# Patient Record
Sex: Female | Born: 2000 | Race: White | Hispanic: No | Smoking: Former smoker
Health system: Southern US, Community
[De-identification: ages and names within clinical notes are randomized; demographics above are authoritative.]

## PROBLEM LIST (undated history)

## (undated) DIAGNOSIS — L309 Dermatitis, unspecified: Secondary | ICD-10-CM

## (undated) DIAGNOSIS — F319 Bipolar disorder, unspecified: Secondary | ICD-10-CM

## (undated) DIAGNOSIS — F419 Anxiety disorder, unspecified: Secondary | ICD-10-CM

## (undated) DIAGNOSIS — J45909 Unspecified asthma, uncomplicated: Secondary | ICD-10-CM

## (undated) DIAGNOSIS — G43909 Migraine, unspecified, not intractable, without status migrainosus: Secondary | ICD-10-CM

## (undated) HISTORY — DX: Dermatitis, unspecified: L30.9

## (undated) HISTORY — DX: Migraine, unspecified, not intractable, without status migrainosus: G43.909

## (undated) HISTORY — DX: Bipolar disorder, unspecified: F31.9

## (undated) HISTORY — DX: Anxiety disorder, unspecified: F41.9

## (undated) HISTORY — DX: Unspecified asthma, uncomplicated: J45.909

---
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
## 2018-03-17 DIAGNOSIS — H5203 Hypermetropia, bilateral: Secondary | ICD-10-CM | POA: Diagnosis not present

## 2018-03-30 NOTE — Progress Notes (Signed)
   [facility] Phone: [phone]   Date of Visit: 04/01/2018   HPI:  Right Lateral Thigh Pain:  -Reports right lateral thigh pain for the past 2weeks  -denies any modification of activities recently and denies any injury -Pain is mainly in the lateral hip to mid lateral thigh and does not radiate down to the foot -Feels like "bone breaking" -Only happens when she changes from a seated to a standing position.  Unsure how long this lasts but reports that she has to "walk it off" -She reports of intermittent chronic back pain but this has not worsened -Denies any lower extremity weakness, numbness or tingling -Denies any urinary or bowel issues -Denies any pain at night while sleeping -Has tried ibuprofen's once or twice which has not helped  ROS: See HPI.  PMFSH:  Obesity Bipolar DO   PHYSICAL EXAM: BP 124/74   Pulse 77   Temp 98.4 F (36.9 C) (Oral)   Ht 5\' 4"  (1.626 m)   Wt (!) 332 lb (150.6 kg)   LMP 03/26/2018 (Approximate)   SpO2 99%   BMI 56.99 kg/m  GEN: NAD  CV: RRR, no murmurs, rubs, or gallops PULM: CTAB, normal effort MSK: Hip: ROM: normal  Strength: normal other than 4/5 right hip abduction  Right greater trochanter WITH tenderness to palpation. No tenderness over piriformis  No SI joint tenderness Extremity: normal dorsalis pedis and posterior tibial pulses. Normal sensation to light touch.  SKIN: No rash or cyanosis; warm and well-perfused PSYCH: Mood and affect euthymic, normal rate and volume of speech NEURO: Awake, alert, no focal deficits grossly, normal speech   ASSESSMENT/PLAN:  1. Trochanteric bursitis, right hip Symptoms most likely consistent with trochanteric bursitis.  Unlikely this is coming from her back.  No red flags on exam or on history.  Discussed management options including anti-inflammatory medication, steroid course, steroid injection to the bursa.  Patient opted to do oral anti-inflammatory medication.   Naproxen 500 mg twice daily with meals for 5 days then as needed.  Provided exercises as well.  Follow-up if symptoms do not resolve  Palma HolterKanishka G Lily Kernen, MD PGY 3 [facility]

## 2018-04-01 ENCOUNTER — Ambulatory Visit (INDEPENDENT_AMBULATORY_CARE_PROVIDER_SITE_OTHER): Payer: Medicaid Other | Admitting: Internal Medicine

## 2018-04-01 ENCOUNTER — Encounter: Payer: Self-pay | Admitting: Internal Medicine

## 2018-04-01 ENCOUNTER — Other Ambulatory Visit: Payer: Self-pay

## 2018-04-01 VITALS — BP 124/74 | HR 77 | Temp 98.4°F | Ht 64.0 in | Wt 332.0 lb

## 2018-04-01 DIAGNOSIS — M7061 Trochanteric bursitis, right hip: Secondary | ICD-10-CM

## 2018-04-01 MED ORDER — NAPROXEN 500 MG PO TABS: 500.0000 mg | ORAL_TABLET | Freq: Two times a day (BID) | ORAL | 0 refills | Status: DC

## 2018-04-01 NOTE — Patient Instructions (Signed)
Take Naproxen 1 tablet twice a day with food for 5 days, then as needed. Follow up if symptoms do not improve in the next 1-2 weeks.  Trochanteric Bursitis Trochanteric bursitis is a condition that causes hip pain. Trochanteric bursitis happens when fluid-filled sacs (bursae) in the hip get irritated. Normally these sacs absorb shock and help strong bands of tissue (tendons) in your hip glide smoothly over each other and over your hip bones. What are the causes? This condition results from increased friction between the hip bones and the tendons that go over them. This condition can happen if you:  Have weak hips.  Use your hip muscles too much (overuse).  Get hit in the hip.  What increases the risk? This condition is more likely to develop in:  Women.  Adults who are middle-aged or older.  People with arthritis or a spinal condition.  People with weak buttocks muscles (gluteal muscles).  People who have one leg that is shorter than the other.  People who participate in certain kinds of athletic activities, such as: ? Running sports, especially long-distance running. ? Contact sports, like football or martial arts. ? Sports in which falls may occur, like skiing.  What are the signs or symptoms? The main symptom of this condition is pain and tenderness over the point of your hip. The pain may be:  Sharp and intense.  Dull and achy.  Felt on the outside of your thigh.  It may increase when you:  Lie on your side.  Walk or run.  Go up on stairs.  Sit.  Stand up after sitting.  Stand for long periods of time.  How is this diagnosed? This condition may be diagnosed based on:  Your symptoms.  Your medical history.  A physical exam.  Imaging tests, such as: ? X-rays to check your bones. ? An MRI or ultrasound to check your tendons and muscles.  During your physical exam, your health care provider will check the movement and strength of your hip. He or she  may press on the point of your hip to check for pain. How is this treated? This condition may be treated by:  Resting.  Reducing your activity.  Avoiding activities that cause pain.  Using crutches, a cane, or a walker to decrease the strain on your hip.  Taking medicine to help with swelling.  Having medicine injected into the bursae to help with swelling.  Using ice, heat, and massage therapy for pain relief.  Physical therapy exercises for strength and flexibility.  Surgery (rare).  Follow these instructions at home: Activity  Rest.  Avoid activities that cause pain.  Return to your normal activities as told by your health care provider. Ask your health care provider what activities are safe for you. Managing pain, stiffness, and swelling  Take over-the-counter and prescription medicines only as told by your health care provider.  If directed, apply heat to the injured area as told by your health care provider. ? Place a towel between your skin and the heat source. ? Leave the heat on for 20-30 minutes. ? Remove the heat if your skin turns bright red. This is especially important if you are unable to feel pain, heat, or cold. You may have a greater risk of getting burned.  If directed, apply ice to the injured area: ? Put ice in a plastic bag. ? Place a towel between your skin and the bag. ? Leave the ice on for 20 minutes, 2-3 times  a day. General instructions  If the affected leg is one that you use for driving, ask your health care provider when it is safe to drive.  Use crutches, a cane, or a walker as told by your health care provider.  If one of your legs is shorter than the other, get fitted for a shoe insert.  Lose weight if you are overweight. How is this prevented?  Wear supportive footwear that is appropriate for your sport.  If you have hip pain, start any new exercise or sport slowly.  Maintain physical fitness,  including: ? Strength. ? Flexibility. Contact a health care provider if:  Your pain does not improve with 2-4 weeks. Get help right away if:  You develop severe pain.  You have a fever.  You develop increased redness over your hip.  You have a change in your bowel function or bladder function.  You cannot control the muscles in your feet. This information is not intended to replace advice given to you by your health care provider. Make sure you discuss any questions you have with your health care provider. Document Released: 11/14/2004 Document Revised: 06/12/2016 Document Reviewed: 09/22/2015 Elsevier Interactive Patient Education  2018 Elsevier Inc.   Trochanteric Bursitis Rehab Ask your health care provider which exercises are safe for you. Do exercises exactly as told by your health care provider and adjust them as directed. It is normal to feel mild stretching, pulling, tightness, or discomfort as you do these exercises, but you should stop right away if you feel sudden pain or your pain gets worse.Do not begin these exercises until told by your health care provider. Stretching exercises These exercises warm up your muscles and joints and improve the movement and flexibility of your hip. These exercises also help to relieve pain and stiffness. Exercise A: Iliotibial band stretch  1. Lie on your side with your left / right leg in the top position. 2. Bend your left / right knee and grab your ankle. 3. Slowly bring your knee back so your thigh is behind your body. 4. Slowly lower your knee toward the floor until you feel a gentle stretch on the outside of your left / right thigh. If you do not feel a stretch and your knee will not fall farther, place the heel of your other foot on top of your outer knee and pull your thigh down farther. 5. Hold this position for __________ seconds. 6. Slowly return to the starting position. Repeat __________ times. Complete this exercise  __________ times a day. Strengthening exercises These exercises build strength and endurance in your hip and pelvis. Endurance is the ability to use your muscles for a long time, even after they get tired. Exercise B: Bridge ( hip extensors) 1. Lie on your back on a firm surface with your knees bent and your feet flat on the floor. 2. Tighten your buttocks muscles and lift your buttocks off the floor until your trunk is level with your thighs. You should feel the muscles working in your buttocks and the back of your thighs. If this exercise is too easy, try doing it with your arms crossed over your chest. 3. Hold this position for __________ seconds. 4. Slowly return to the starting position. 5. Let your muscles relax completely between repetitions. Repeat __________ times. Complete this exercise __________ times a day. Exercise C: Squats ( knee extensors and  quadriceps) 1. Stand in front of a table, with your feet and knees pointing straight ahead. You  may rest your hands on the table for balance but not for support. 2. Slowly bend your knees and lower your hips like you are going to sit in a chair. ? Keep your weight over your heels, not over your toes. ? Keep your lower legs upright so they are parallel with the table legs. ? Do not let your hips go lower than your knees. ? Do not bend lower than told by your health care provider. ? If your hip pain increases, do not bend as low. 3. Hold this position for __________ seconds. 4. Slowly push with your legs to return to standing. Do not use your hands to pull yourself to standing. Repeat __________ times. Complete this exercise __________ times a day. Exercise D: Hip hike 1. Stand sideways on a bottom step. Stand on your left / right leg with your other foot unsupported next to the step. You can hold onto the railing or wall if needed for balance. 2. Keeping your knees straight and your torso square, lift your left / right hip up toward the  ceiling. 3. Hold this position for __________ seconds. 4. Slowly let your left / right hip lower toward the floor, past the starting position. Your foot should get closer to the floor. Do not lean or bend your knees. Repeat __________ times. Complete this exercise __________ times a day. Exercise E: Single leg stand 1. Stand near a counter or door frame that you can hold onto for balance as needed. It is helpful to stand in front of a mirror for this exercise so you can watch your hip. 2. Squeeze your left / right buttock muscles then lift up your other foot. Do not let your left / right hip push out to the side. 3. Hold this position for __________ seconds. Repeat __________ times. Complete this exercise __________ times a day. This information is not intended to replace advice given to you by your health care provider. Make sure you discuss any questions you have with your health care provider. Document Released: 11/14/2004 Document Revised: 06/13/2016 Document Reviewed: 09/22/2015 Elsevier Interactive Patient Education  [employer]2018 Else[employer] Inc.

## 2018-04-22 DIAGNOSIS — F439 Reaction to severe stress, unspecified: Secondary | ICD-10-CM | POA: Diagnosis not present

## 2018-04-22 DIAGNOSIS — F319 Bipolar disorder, unspecified: Secondary | ICD-10-CM | POA: Diagnosis not present

## 2018-04-30 DIAGNOSIS — F319 Bipolar disorder, unspecified: Secondary | ICD-10-CM | POA: Diagnosis not present

## 2018-04-30 DIAGNOSIS — F439 Reaction to severe stress, unspecified: Secondary | ICD-10-CM | POA: Diagnosis not present

## 2018-05-12 ENCOUNTER — Ambulatory Visit: Payer: Medicaid Other | Admitting: Audiology

## 2018-05-13 ENCOUNTER — Ambulatory Visit: Payer: Medicaid Other | Attending: Audiology | Admitting: Audiology

## 2018-05-13 DIAGNOSIS — R292 Abnormal reflex: Secondary | ICD-10-CM

## 2018-05-13 DIAGNOSIS — H748X9 Other specified disorders of middle ear and mastoid, unspecified ear: Secondary | ICD-10-CM

## 2018-05-13 DIAGNOSIS — Z01110 Encounter for hearing examination following failed hearing screening: Secondary | ICD-10-CM

## 2018-05-13 DIAGNOSIS — H9313 Tinnitus, bilateral: Secondary | ICD-10-CM | POA: Diagnosis not present

## 2018-05-13 DIAGNOSIS — H833X3 Noise effects on inner ear, bilateral: Secondary | ICD-10-CM

## 2018-05-13 NOTE — Procedures (Signed)
[facility] [address] [place], [place] [address] Main: [phone] Fax: [phone]  AUDIOLOGICAL EVALUATION  NAME:            Anita [name]           DATE:                        05/13/2018 DOB:               Dec 01, 2000                                REFERRAL:               Hearing decreased, right MRN:               [id number]                              REFERRENT:             Leland HerYoo, Elsia J, DO  CASE HISTORY Anita RenoEmma Leann Staffieri, an 17 y.o.-year-old female, was seen for a repeat audiological evaluation. She was previously seen here on 11/12/2017 with a 40dBHL hearing threshold at 6000Hz  on the left side only with other hearing thresholds within normal limits with reports of tinniutus.  Today, [name] continues to report intermittent tinnitus that occurs "maybe once a day and lasts less than one minute". She noted that she has had tinnitus since her childhood.   She noted "more than twelve" childhood ear infections.  Today, [name] denied any recent otalgia, pressure, or colds, as well as any history of sound sensitivity and ear surgeries. She has not noticed a change in her tinnitus.  No pain was noted.  TEST PROCEDURES Tympanometry, ipsilateral and contralateral acoustic reflexes, pure tone audiometry and speech audiometry were administered.  TEST RESULTS Tympanometry showed normal volume pressure and compliance bilaterally (Type A). Ipsilateral acoustic reflexes were 95-100dB on there right and 90-95dB on the left. Contralateral acoustic reflexes were 100-105dB in each ear from 500Hz  -4000Hz .  Pure tone audiometry hearing thresholds are 0-20 dBHL bilaterally from 250Hz  - 8000Hz  except for a 25 dBHL sensorineural hearing loss in the left ear only at 6000Hz  - which is improved compared to the previous test results.   Speech audiometry revealed speech reception thresholds at 10 dB HL in the right and left ears.  Word recognition in quiet scores  were 100% at 50 dB HL in the right and left ears using recorded NU-6 word lists.  Speech-in-noise scores were 76% at +5 dB SNR.   The overall test reliability was judged to be good. [name] reported uncomfortable loudness levels of 85dBHL which is consistent with slight sound sensitivity.  SUMMARY AND RECOMMENDATIONS Summary: The hearing thresholds are improved compared to January 2019, in particular the 40dBHL hearing threshold at 6000Hz  documented in January 2019 is now 25 dBHL. [name] has normal hearing thresholds bilaterally. She continues to have excellent word recognition in quiet that remains good in minimal background noise in each ear.   Although [name] continues to report intermittent tinnitus, she does not report that it has become louder or more frequent. Please note that tinnitus was not measured during her visit here because she was not experiencing it.  Hearing and tinnitus appear stable. Please note that [name] reports slight sound sensitivity, which is not  new. Hearing conservation was encouraged.  Recommendations: 1.  Monitor hearing at home and at the physician's office. If hearing concerns or tinnitus worsen, please schedule a repeat audiological evaluation here.  2.  Hearing conservation was discussed.   Anita Reno Hubbs will contact us with any questions or concerns.  Lewie Loron, AuD, CCC-A Doctor of Audiology

## 2018-05-14 DIAGNOSIS — F319 Bipolar disorder, unspecified: Secondary | ICD-10-CM | POA: Diagnosis not present

## 2018-05-14 DIAGNOSIS — F439 Reaction to severe stress, unspecified: Secondary | ICD-10-CM | POA: Diagnosis not present

## 2018-05-21 DIAGNOSIS — F439 Reaction to severe stress, unspecified: Secondary | ICD-10-CM | POA: Diagnosis not present

## 2018-05-21 DIAGNOSIS — F319 Bipolar disorder, unspecified: Secondary | ICD-10-CM | POA: Diagnosis not present

## 2018-05-28 DIAGNOSIS — F319 Bipolar disorder, unspecified: Secondary | ICD-10-CM | POA: Diagnosis not present

## 2018-05-28 DIAGNOSIS — F439 Reaction to severe stress, unspecified: Secondary | ICD-10-CM | POA: Diagnosis not present

## 2018-06-04 DIAGNOSIS — F319 Bipolar disorder, unspecified: Secondary | ICD-10-CM | POA: Diagnosis not present

## 2018-06-04 DIAGNOSIS — F439 Reaction to severe stress, unspecified: Secondary | ICD-10-CM | POA: Diagnosis not present

## 2018-06-17 DIAGNOSIS — F319 Bipolar disorder, unspecified: Secondary | ICD-10-CM | POA: Diagnosis not present

## 2018-06-17 DIAGNOSIS — F439 Reaction to severe stress, unspecified: Secondary | ICD-10-CM | POA: Diagnosis not present

## 2018-06-18 DIAGNOSIS — F319 Bipolar disorder, unspecified: Secondary | ICD-10-CM | POA: Diagnosis not present

## 2018-06-18 DIAGNOSIS — F439 Reaction to severe stress, unspecified: Secondary | ICD-10-CM | POA: Diagnosis not present

## 2018-06-25 DIAGNOSIS — F319 Bipolar disorder, unspecified: Secondary | ICD-10-CM | POA: Diagnosis not present

## 2018-06-25 DIAGNOSIS — F439 Reaction to severe stress, unspecified: Secondary | ICD-10-CM | POA: Diagnosis not present

## 2018-07-02 ENCOUNTER — Ambulatory Visit (INDEPENDENT_AMBULATORY_CARE_PROVIDER_SITE_OTHER): Payer: Medicaid Other | Admitting: Family Medicine

## 2018-07-02 ENCOUNTER — Other Ambulatory Visit: Payer: Self-pay

## 2018-07-02 ENCOUNTER — Encounter: Payer: Self-pay | Admitting: Family Medicine

## 2018-07-02 VITALS — BP 110/65 | HR 108 | Temp 98.4°F | Ht 64.0 in | Wt 319.0 lb

## 2018-07-02 DIAGNOSIS — Z23 Encounter for immunization: Secondary | ICD-10-CM

## 2018-07-02 DIAGNOSIS — F319 Bipolar disorder, unspecified: Secondary | ICD-10-CM | POA: Diagnosis not present

## 2018-07-02 DIAGNOSIS — M67432 Ganglion, left wrist: Secondary | ICD-10-CM

## 2018-07-02 DIAGNOSIS — F439 Reaction to severe stress, unspecified: Secondary | ICD-10-CM | POA: Diagnosis not present

## 2018-07-02 MED ORDER — WRIST SPLINT/COCK-UP/LEFT XL MISC: 1.0000 [IU] | Freq: Once | 0 refills | Status: AC

## 2018-07-02 NOTE — Progress Notes (Signed)
   Subjective:    Patient ID: Anita [name] is a 17 y.o. female presenting with Wrist Pain  on 07/02/2018  HPI: Here today for left wrist pain. Has swelling on her wrist. Both the lateral and medial sides. Has noted this x 3 wks. Iced it but this did not help. Has some pain in the area. Has tried Ibuprofen which has not really helped. Worse with working.  Review of Systems  Constitutional: Negative for chills and fever.  Respiratory: Negative for shortness of breath.   Cardiovascular: Negative for chest pain.  Gastrointestinal: Negative for abdominal pain, nausea and vomiting.  Genitourinary: Negative for dysuria.  Musculoskeletal: Positive for joint swelling.  Skin: Negative for rash.      Objective:    BP 110/65   Pulse (!) 108   Temp 98.4 F (36.9 C) (Oral)   Ht 5\' 4"  (1.626 m)   Wt (!) 319 lb (144.7 kg)   SpO2 97%   BMI 54.76 kg/m  Physical Exam  Constitutional: She is oriented to person, place, and time. She appears well-developed and well-nourished. No distress.  HENT:  Head: Normocephalic and atraumatic.  Eyes: No scleral icterus.  Neck: Neck supple.  Cardiovascular: Normal rate.  Pulmonary/Chest: Effort normal.  Abdominal: Soft.  Musculoskeletal:       Left wrist: She exhibits swelling.  Soft mobile swelling over the tendon sheath c/w ganglion cyst  Neurological: She is alert and oriented to person, place, and time.  Skin: Skin is warm and dry.  Psychiatric: She has a normal mood and affect.        Assessment & Plan:   Problem List Items Addressed This Visit      Unprioritized   Ganglion, left wrist - Primary    Written and verbal information given. Treatment options reviewed. Patient opted for trial of conservative therapy with wrist splint and NSAIDS.       Other Visit Diagnoses    Need for immunization against influenza       Relevant Orders   Flu Vaccine QUAD 36+ mos IM (Completed)      Total face-to-face time with patient: 15=0  minutes. Over 50% of encounter was spent on counseling and coordination of care. Return if symptoms worsen or fail to improve.  Reva Boresanya S Corrissa Martello 07/02/2018 3:02 PM

## 2018-07-02 NOTE — Patient Instructions (Signed)
Ganglion Cyst A ganglion cyst is a noncancerous, fluid-filled lump that occurs near joints or tendons. The ganglion cyst grows out of a joint or the lining of a tendon. It most often develops in the hand or wrist, but it can also develop in the shoulder, elbow, hip, knee, ankle, or foot. The round or oval ganglion cyst can be the size of a pea or larger than a grape. Increased activity may enlarge the size of the cyst because more fluid starts to build up. What are the causes? It is not known what causes a ganglion cyst to grow. However, it may be related to:  Inflammation or irritation around the joint.  An injury.  Repetitive movements or overuse.  Arthritis.  What increases the risk? Risk factors include:  Being a woman.  Being age 20-50.  What are the signs or symptoms? Symptoms may include:  A lump. This most often appears on the hand or wrist, but it can occur in other areas of the body.  Tingling.  Pain.  Numbness.  Muscle weakness.  Weak grip.  Less movement in a joint.  How is this diagnosed? Ganglion cysts are most often diagnosed based on a physical exam. Your health care provider will feel the lump and may shine a light alongside it. If it is a ganglion cyst, a light often shines through it. Your health care provider may order an X-ray, ultrasound, or MRI to rule out other conditions. How is this treated? Ganglion cysts usually go away on their own without treatment. If pain or other symptoms are involved, treatment may be needed. Treatment is also needed if the ganglion cyst limits your movement or if it gets infected. Treatment may include:  Wearing a brace or splint on your wrist or finger.  Taking anti-inflammatory medicine.  Draining fluid from the lump with a needle (aspiration).  Injecting a steroid into the joint.  Surgery to remove the ganglion cyst.  Follow these instructions at home:  Do not press on the ganglion cyst, poke it with a  needle, or hit it.  Take medicines only as directed by your health care provider.  Wear your brace or splint as directed by your health care provider.  Watch your ganglion cyst for any changes.  Keep all follow-up visits as directed by your health care provider. This is important. Contact a health care provider if:  Your ganglion cyst becomes larger or more painful.  You have increased redness, red streaks, or swelling.  You have pus coming from the lump.  You have weakness or numbness in the affected area.  You have a fever or chills. This information is not intended to replace advice given to you by your health care provider. Make sure you discuss any questions you have with your health care provider. Document Released: 10/04/2000 Document Revised: 03/14/2016 Document Reviewed: 03/22/2014 Elsevier Interactive Patient Education  2018 Elsevier Inc.  

## 2018-07-04 ENCOUNTER — Encounter: Payer: Self-pay | Admitting: Family Medicine

## 2018-07-04 NOTE — Assessment & Plan Note (Signed)
Written and verbal information given. Treatment options reviewed. Patient opted for trial of conservative therapy with wrist splint and NSAIDS.

## 2018-07-09 DIAGNOSIS — F319 Bipolar disorder, unspecified: Secondary | ICD-10-CM | POA: Diagnosis not present

## 2018-07-09 DIAGNOSIS — F439 Reaction to severe stress, unspecified: Secondary | ICD-10-CM | POA: Diagnosis not present

## 2018-07-16 DIAGNOSIS — F319 Bipolar disorder, unspecified: Secondary | ICD-10-CM | POA: Diagnosis not present

## 2018-07-16 DIAGNOSIS — F439 Reaction to severe stress, unspecified: Secondary | ICD-10-CM | POA: Diagnosis not present

## 2018-07-23 DIAGNOSIS — F439 Reaction to severe stress, unspecified: Secondary | ICD-10-CM | POA: Diagnosis not present

## 2018-07-23 DIAGNOSIS — F319 Bipolar disorder, unspecified: Secondary | ICD-10-CM | POA: Diagnosis not present

## 2018-07-29 DIAGNOSIS — F319 Bipolar disorder, unspecified: Secondary | ICD-10-CM | POA: Diagnosis not present

## 2018-07-29 DIAGNOSIS — F439 Reaction to severe stress, unspecified: Secondary | ICD-10-CM | POA: Diagnosis not present

## 2018-07-30 DIAGNOSIS — F439 Reaction to severe stress, unspecified: Secondary | ICD-10-CM | POA: Diagnosis not present

## 2018-07-30 DIAGNOSIS — F319 Bipolar disorder, unspecified: Secondary | ICD-10-CM | POA: Diagnosis not present

## 2018-08-06 DIAGNOSIS — F439 Reaction to severe stress, unspecified: Secondary | ICD-10-CM | POA: Diagnosis not present

## 2018-08-06 DIAGNOSIS — F319 Bipolar disorder, unspecified: Secondary | ICD-10-CM | POA: Diagnosis not present

## 2018-08-13 DIAGNOSIS — F439 Reaction to severe stress, unspecified: Secondary | ICD-10-CM | POA: Diagnosis not present

## 2018-08-13 DIAGNOSIS — F319 Bipolar disorder, unspecified: Secondary | ICD-10-CM | POA: Diagnosis not present

## 2018-08-20 DIAGNOSIS — F439 Reaction to severe stress, unspecified: Secondary | ICD-10-CM | POA: Diagnosis not present

## 2018-08-20 DIAGNOSIS — F319 Bipolar disorder, unspecified: Secondary | ICD-10-CM | POA: Diagnosis not present

## 2018-08-27 DIAGNOSIS — F319 Bipolar disorder, unspecified: Secondary | ICD-10-CM | POA: Diagnosis not present

## 2018-08-27 DIAGNOSIS — F439 Reaction to severe stress, unspecified: Secondary | ICD-10-CM | POA: Diagnosis not present

## 2018-09-03 DIAGNOSIS — F319 Bipolar disorder, unspecified: Secondary | ICD-10-CM | POA: Diagnosis not present

## 2018-09-03 DIAGNOSIS — F439 Reaction to severe stress, unspecified: Secondary | ICD-10-CM | POA: Diagnosis not present

## 2018-09-10 DIAGNOSIS — F439 Reaction to severe stress, unspecified: Secondary | ICD-10-CM | POA: Diagnosis not present

## 2018-09-10 DIAGNOSIS — F319 Bipolar disorder, unspecified: Secondary | ICD-10-CM | POA: Diagnosis not present

## 2018-09-15 DIAGNOSIS — F439 Reaction to severe stress, unspecified: Secondary | ICD-10-CM | POA: Diagnosis not present

## 2018-09-15 DIAGNOSIS — F319 Bipolar disorder, unspecified: Secondary | ICD-10-CM | POA: Diagnosis not present

## 2018-09-16 ENCOUNTER — Encounter (HOSPITAL_COMMUNITY): Payer: Self-pay

## 2018-09-16 ENCOUNTER — Emergency Department (HOSPITAL_COMMUNITY)
Admission: EM | Admit: 2018-09-16 | Discharge: 2018-09-16 | Disposition: A | Discharge status: Home / Self Care | Payer: Medicaid Other | Attending: Emergency Medicine | Admitting: Emergency Medicine

## 2018-09-16 DIAGNOSIS — S39012A Strain of muscle, fascia and tendon of lower back, initial encounter: Secondary | ICD-10-CM | POA: Diagnosis not present

## 2018-09-16 DIAGNOSIS — Z79899 Other long term (current) drug therapy: Secondary | ICD-10-CM | POA: Insufficient documentation

## 2018-09-16 DIAGNOSIS — S199XXA Unspecified injury of neck, initial encounter: Secondary | ICD-10-CM | POA: Diagnosis present

## 2018-09-16 DIAGNOSIS — Y9389 Activity, other specified: Secondary | ICD-10-CM | POA: Diagnosis not present

## 2018-09-16 DIAGNOSIS — F419 Anxiety disorder, unspecified: Secondary | ICD-10-CM | POA: Diagnosis not present

## 2018-09-16 DIAGNOSIS — S161XXA Strain of muscle, fascia and tendon at neck level, initial encounter: Secondary | ICD-10-CM | POA: Diagnosis not present

## 2018-09-16 DIAGNOSIS — Y998 Other external cause status: Secondary | ICD-10-CM | POA: Insufficient documentation

## 2018-09-16 DIAGNOSIS — F0781 Postconcussional syndrome: Secondary | ICD-10-CM | POA: Diagnosis not present

## 2018-09-16 DIAGNOSIS — J45909 Unspecified asthma, uncomplicated: Secondary | ICD-10-CM | POA: Insufficient documentation

## 2018-09-16 DIAGNOSIS — F319 Bipolar disorder, unspecified: Secondary | ICD-10-CM | POA: Diagnosis not present

## 2018-09-16 DIAGNOSIS — Y92410 Unspecified street and highway as the place of occurrence of the external cause: Secondary | ICD-10-CM | POA: Diagnosis not present

## 2018-09-16 DIAGNOSIS — R51 Headache: Secondary | ICD-10-CM | POA: Diagnosis not present

## 2018-09-16 MED ORDER — IBUPROFEN 800 MG PO TABS: 800.0000 mg | ORAL_TABLET | Freq: Three times a day (TID) | ORAL | 0 refills | Status: DC
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
## 2018-09-24 DIAGNOSIS — F439 Reaction to severe stress, unspecified: Secondary | ICD-10-CM | POA: Diagnosis not present

## 2018-09-24 DIAGNOSIS — F319 Bipolar disorder, unspecified: Secondary | ICD-10-CM | POA: Diagnosis not present

## 2018-10-01 DIAGNOSIS — F439 Reaction to severe stress, unspecified: Secondary | ICD-10-CM | POA: Diagnosis not present

## 2018-10-01 DIAGNOSIS — F319 Bipolar disorder, unspecified: Secondary | ICD-10-CM | POA: Diagnosis not present

## 2018-10-08 DIAGNOSIS — F439 Reaction to severe stress, unspecified: Secondary | ICD-10-CM | POA: Diagnosis not present

## 2018-10-08 DIAGNOSIS — F319 Bipolar disorder, unspecified: Secondary | ICD-10-CM | POA: Diagnosis not present

## 2018-10-22 DIAGNOSIS — F319 Bipolar disorder, unspecified: Secondary | ICD-10-CM | POA: Diagnosis not present

## 2018-10-22 DIAGNOSIS — F439 Reaction to severe stress, unspecified: Secondary | ICD-10-CM | POA: Diagnosis not present

## 2018-10-29 DIAGNOSIS — F439 Reaction to severe stress, unspecified: Secondary | ICD-10-CM | POA: Diagnosis not present

## 2018-10-29 DIAGNOSIS — F319 Bipolar disorder, unspecified: Secondary | ICD-10-CM | POA: Diagnosis not present

## 2018-11-05 DIAGNOSIS — F319 Bipolar disorder, unspecified: Secondary | ICD-10-CM | POA: Diagnosis not present

## 2018-11-05 DIAGNOSIS — F439 Reaction to severe stress, unspecified: Secondary | ICD-10-CM | POA: Diagnosis not present

## 2018-11-12 DIAGNOSIS — F319 Bipolar disorder, unspecified: Secondary | ICD-10-CM | POA: Diagnosis not present

## 2018-11-12 DIAGNOSIS — F439 Reaction to severe stress, unspecified: Secondary | ICD-10-CM | POA: Diagnosis not present

## 2018-11-19 DIAGNOSIS — F439 Reaction to severe stress, unspecified: Secondary | ICD-10-CM | POA: Diagnosis not present

## 2018-11-19 DIAGNOSIS — F319 Bipolar disorder, unspecified: Secondary | ICD-10-CM | POA: Diagnosis not present

## 2018-11-26 DIAGNOSIS — F319 Bipolar disorder, unspecified: Secondary | ICD-10-CM | POA: Diagnosis not present

## 2018-11-26 DIAGNOSIS — F439 Reaction to severe stress, unspecified: Secondary | ICD-10-CM | POA: Diagnosis not present

## 2018-12-03 DIAGNOSIS — F319 Bipolar disorder, unspecified: Secondary | ICD-10-CM | POA: Diagnosis not present

## 2018-12-03 DIAGNOSIS — F439 Reaction to severe stress, unspecified: Secondary | ICD-10-CM | POA: Diagnosis not present

## 2018-12-10 DIAGNOSIS — F319 Bipolar disorder, unspecified: Secondary | ICD-10-CM | POA: Diagnosis not present

## 2018-12-10 DIAGNOSIS — F439 Reaction to severe stress, unspecified: Secondary | ICD-10-CM | POA: Diagnosis not present

## 2018-12-17 DIAGNOSIS — F439 Reaction to severe stress, unspecified: Secondary | ICD-10-CM | POA: Diagnosis not present

## 2018-12-17 DIAGNOSIS — F319 Bipolar disorder, unspecified: Secondary | ICD-10-CM | POA: Diagnosis not present

## 2018-12-22 ENCOUNTER — Encounter (HOSPITAL_COMMUNITY): Payer: Self-pay

## 2018-12-22 ENCOUNTER — Ambulatory Visit (HOSPITAL_COMMUNITY)
Admission: EM | Admit: 2018-12-22 | Discharge: 2018-12-22 | Disposition: A | Discharge status: Home / Self Care | Payer: Medicaid Other | Attending: Family Medicine | Admitting: Family Medicine

## 2018-12-22 DIAGNOSIS — B9789 Other viral agents as the cause of diseases classified elsewhere: Secondary | ICD-10-CM

## 2018-12-22 DIAGNOSIS — J069 Acute upper respiratory infection, unspecified: Secondary | ICD-10-CM | POA: Diagnosis not present

## 2018-12-22 MED ORDER — CETIRIZINE HCL 10 MG PO CAPS: 10.0000 mg | ORAL_CAPSULE | Freq: Every day | ORAL | 0 refills | Status: DC

## 2018-12-22 MED ORDER — FLUTICASONE PROPIONATE 50 MCG/ACT NA SUSP: 1.0000 | Freq: Every day | NASAL | 0 refills | Status: DC

## 2018-12-22 NOTE — ED Provider Notes (Signed)
[facility]    CSN: [id number] Arrival date & time: 12/22/18  1307     History   Chief Complaint Chief Complaint  Patient presents with  . Sinus Issues  . Otalgia    Right  . Sore Throat    HPI Julizza Sassone is a 18 y.o. female history of anxiety, asthma, bipolar disorder, presenting today for evaluation of congestion, sore throat and ear pain.  Patient states that her symptoms began approximately 2 days ago.  Her main complaint is feeling right-sided pressure and congestion.  Has pain in her right ear as well as feels pressure behind her right eye.  She has had some rhinorrhea and is noted the drainage to mainly be clear.  Cough is been mild.  Denies any known fevers.  She is not taking anything over-the-counter for her symptoms.  Denies shortness of breath or chest pain.  HPI  Past Medical History:  Diagnosis Date  . Anxiety   . Asthma    age 76  . Bipolar disorder (HCC)   . Eczema   . Migraines     Patient Active Problem List   Diagnosis Date Noted  . Ganglion, left wrist 07/02/2018  . Eustachian tube disorder, bilateral 04/14/2017  . Hearing decreased, right 04/14/2017  . Obesity 10/16/2016  . Bipolar disorder [facility]     Past Surgical History:  Procedure Laterality Date  . KNEE SURGERY  01/23/2015   L knee torn meniscus and ACL  . TONSILLECTOMY AND ADENOIDECTOMY  2012    OB History   No obstetric history on file.      Home Medications    Prior to Admission medications   Medication Sig Start Date End Date Taking? Authorizing Provider  amitriptyline (ELAVIL) 25 MG tablet Take 50 mg by mouth at bedtime.     [provider]  Cetirizine HCl 10 MG CAPS Take 1 capsule (10 mg total) by mouth daily for 10 days. 12/22/18 01/01/19  Wieters, Hallie C, PA-C  fluticasone (FLONASE) 50 MCG/ACT nasal spray Place 1-2 sprays into both nostrils daily for 7 days. 12/22/18 12/29/18  Wieters, Hallie C, PA-C  ibuprofen (ADVIL,MOTRIN) 800 MG tablet Take 1  tablet (800 mg total) by mouth 3 (three) times daily. 09/16/18   Roxy Horseman, PA-C  Melatonin 10 MG TABS Take 1 tablet by mouth daily. 10/08/16   Leland Her, DO  Oxcarbazepine (TRILEPTAL) 300 MG tablet Take 2 tablets (600 mg total) by mouth at bedtime. Patient taking differently: Take 300 mg by mouth 3 (three) times daily.  12/17/16   Leland Her, DO    Family History Family History  Problem Relation Age of Onset  . Alcohol abuse Mother   . Depression Mother   . Thyroid disease Mother   . Heart attack Mother 38       died of heart attack  . Bipolar disorder Mother   . Vision loss Sister     Social History Social History   Tobacco Use  . Smoking status: Never Smoker  . Smokeless tobacco: Never Used  Substance Use Topics  . Alcohol use: Yes  . Drug use: No    Comment: past marijuana use, former     Allergies   Patient has no known allergies.   Review of Systems Review of Systems  Constitutional: Negative for activity change, appetite change, chills, fatigue and fever.  HENT: Positive for congestion, rhinorrhea, sinus pressure and sore throat. Negative for ear pain and trouble swallowing.  Eyes: Negative for discharge and redness.  Respiratory: Positive for cough. Negative for chest tightness and shortness of breath.   Cardiovascular: Negative for chest pain.  Gastrointestinal: Negative for abdominal pain, diarrhea, nausea and vomiting.  Musculoskeletal: Negative for myalgias.  Skin: Negative for rash.  Neurological: Negative for dizziness, light-headedness and headaches.     Physical Exam Triage Vital Signs ED Triage Vitals  Enc Vitals Group     BP 12/22/18 1405 111/76     Pulse Rate 12/22/18 1405 (!) 102     Resp 12/22/18 1405 18     Temp 12/22/18 1405 98.2 F (36.8 C)     Temp Source 12/22/18 1405 Oral     SpO2 12/22/18 1405 99 %     Weight --      Height --      Head Circumference --      Peak Flow --      Pain Score 12/22/18 1406 6     Pain  Loc --      Pain Edu? --      Excl. in GC? --    No data found.  Updated Vital Signs BP 111/76 (BP Location: Right Arm)   Pulse (!) 102   Temp 98.2 F (36.8 C) (Oral)   Resp 18   LMP 12/08/2018   SpO2 99%   Visual Acuity Right Eye Distance:   Left Eye Distance:   Bilateral Distance:    Right Eye Near:   Left Eye Near:    Bilateral Near:     Physical Exam Vitals signs and nursing note reviewed.  Constitutional:      General: She is not in acute distress.    Appearance: She is well-developed.  HENT:     Head: Normocephalic and atraumatic.     Ears:     Comments: Bilateral ears without tenderness to palpation of external auricle, tragus and mastoid, EAC's without erythema or swelling, TM's with good bony landmarks and cone of light. Non erythematous.    Nose:     Comments: Nasal mucosa erythematous, bilateral swollen turbinates, no rhinorrhea present bilaterally    Mouth/Throat:     Comments: Oral mucosa pink and moist, no tonsillar enlargement or exudate. Posterior pharynx patent and nonerythematous, no uvula deviation or swelling. Normal phonation. Eyes:     Conjunctiva/sclera: Conjunctivae normal.  Neck:     Musculoskeletal: Neck supple.  Cardiovascular:     Rate and Rhythm: Normal rate and regular rhythm.     Heart sounds: No murmur.  Pulmonary:     Effort: Pulmonary effort is normal. No respiratory distress.     Breath sounds: Normal breath sounds.     Comments: Breathing comfortably at rest, CTABL, no wheezing, rales or other adventitious sounds auscultated Abdominal:     Palpations: Abdomen is soft.     Tenderness: There is no abdominal tenderness.  Skin:    General: Skin is warm and dry.  Neurological:     Mental Status: She is alert.      UC Treatments / Results  Labs (all labs ordered are listed, but only abnormal results are displayed) Labs Reviewed - No data to display  EKG None  Radiology No results found.  Procedures Procedures  (including critical care time)  Medications Ordered in UC Medications - No data to display  Initial Impression / Assessment and Plan / UC Course  I have reviewed the triage vital signs and the nursing notes.  Pertinent labs & imaging results that were  available during my care of the patient were reviewed by me and considered in my medical decision making (see chart for details).     URI symptoms x2 days, mainly sinus pressure on right side, vital signs stable, most likely viral etiology, recommend symptomatic and supportive care.  Recommendations below.  Provided prescriptions for Zyrtec and Flonase.  Continue to monitor,Discussed strict return precautions. Patient verbalized understanding and is agreeable with plan.  Final Clinical Impressions(s) / UC Diagnoses   Final diagnoses:  Viral URI with cough     Discharge Instructions     You likely having a viral upper respiratory infection. We recommended symptom control. I expect your symptoms to start improving in the next 1-2 weeks.   1. Take a daily allergy pill/anti-histamine like Zyrtec, Claritin, or Store brand consistently for 2 weeks  2. For congestion you may try an oral decongestant like Mucinex or sudafed. You may also try intranasal flonase nasal spray or saline irrigations (neti pot, sinus cleanse)  3. For your sore throat you may try cepacol lozenges, salt water gargles, throat spray. Treatment of congestion may also help your sore throat.  4. For cough you may try Robitussen, Mucinex DM  5. Take Tylenol or Ibuprofen to help with pain/inflammation  6. Stay hydrated, drink plenty of fluids to keep throat coated and less irritated  Honey Tea For cough/sore throat try using a honey-based tea. Use 3 teaspoons of honey with juice squeezed from half lemon. Place shaved pieces of ginger into 1/2-1 cup of water and warm over stove top. Then mix the ingredients and repeat every 4 hours as needed.   ED Prescriptions     Medication Sig Dispense Auth. Provider   Cetirizine HCl 10 MG CAPS Take 1 capsule (10 mg total) by mouth daily for 10 days. 10 capsule Wieters, Hallie C, PA-C   fluticasone (FLONASE) 50 MCG/ACT nasal spray Place 1-2 sprays into both nostrils daily for 7 days. 1 g Wieters, [place] C, PA-C     Controlled Substance Prescriptions [place] Controlled Substance Registry consulted? Not Applicable   Lew Dawes, [place] 12/22/18 1450

## 2018-12-22 NOTE — Discharge Instructions (Signed)
You likely having a viral upper respiratory infection. We recommended symptom control. I expect your symptoms to start improving in the next 1-2 weeks.  ° °1. Take a daily allergy pill/anti-histamine like Zyrtec, Claritin, or Store brand consistently for 2 weeks ° °2. For congestion you may try an oral decongestant like Mucinex or sudafed. You may also try intranasal flonase nasal spray or saline irrigations (neti pot, sinus cleanse) ° °3. For your sore throat you may try cepacol lozenges, salt water gargles, throat spray. Treatment of congestion may also help your sore throat. ° °4. For cough you may try Robitussen, Mucinex DM ° °5. Take Tylenol or Ibuprofen to help with pain/inflammation ° °6. Stay hydrated, drink plenty of fluids to keep throat coated and less irritated ° °Honey Tea °For cough/sore throat try using a honey-based tea. Use 3 teaspoons of honey with juice squeezed from half lemon. Place shaved pieces of ginger into 1/2-1 cup of water and warm over stove top. Then mix the ingredients and repeat every 4 hours as needed. °

## 2018-12-22 NOTE — ED Triage Notes (Signed)
Pt presents with sinus issues, sore throat, and right ear pain.

## 2018-12-24 DIAGNOSIS — F439 Reaction to severe stress, unspecified: Secondary | ICD-10-CM | POA: Diagnosis not present

## 2018-12-24 DIAGNOSIS — F319 Bipolar disorder, unspecified: Secondary | ICD-10-CM | POA: Diagnosis not present

## 2018-12-28 DIAGNOSIS — F439 Reaction to severe stress, unspecified: Secondary | ICD-10-CM | POA: Diagnosis not present

## 2018-12-28 DIAGNOSIS — F319 Bipolar disorder, unspecified: Secondary | ICD-10-CM | POA: Diagnosis not present

## 2019-04-14 ENCOUNTER — Encounter: Payer: Self-pay | Admitting: Family Medicine

## 2019-04-14 ENCOUNTER — Ambulatory Visit (INDEPENDENT_AMBULATORY_CARE_PROVIDER_SITE_OTHER): Payer: Medicaid Other | Admitting: Family Medicine

## 2019-04-14 ENCOUNTER — Other Ambulatory Visit: Payer: Self-pay

## 2019-04-14 VITALS — BP 116/68 | HR 86

## 2019-04-14 DIAGNOSIS — T7840XA Allergy, unspecified, initial encounter: Secondary | ICD-10-CM | POA: Diagnosis not present

## 2019-04-14 DIAGNOSIS — M545 Low back pain, unspecified: Secondary | ICD-10-CM | POA: Insufficient documentation

## 2019-04-14 DIAGNOSIS — G8929 Other chronic pain: Secondary | ICD-10-CM

## 2019-04-14 MED ORDER — EPINEPHRINE 0.3 MG/0.3ML IJ SOAJ: 0.3000 mg | INTRAMUSCULAR | 0 refills | Status: DC | PRN

## 2019-04-14 NOTE — Assessment & Plan Note (Signed)
Uncertain trigger.  Thankfully this reaction seems mild only without any signs of anaphylaxis.  However since she is unable to avoid her place of work she may continue to have increased exposure and possible sensitization.  Discussed this with patient and she would prefer to have an EpiPen available in case she needs it.  Also offered referral to allergy for patch testing and possible desensitization.  Patient in agreement.

## 2019-04-14 NOTE — Patient Instructions (Signed)
Low Back Sprain Rehab  Ask your health care provider which exercises are safe for you. Do exercises exactly as told by your health care provider and adjust them as directed. It is normal to feel mild stretching, pulling, tightness, or discomfort as you do these exercises, but you should stop right away if you feel sudden pain or your pain gets worse. Do not begin these exercises until told by your health care provider.  Stretching and range of motion exercises  These exercises warm up your muscles and joints and improve the movement and flexibility of your back. These exercises also help to relieve pain, numbness, and tingling.  Exercise A: Lumbar rotation    1. Lie on your back on a firm surface and bend your knees.  2. Straighten your arms out to your sides so each arm forms an "L" shape with a side of your body (a 90 degree angle).  3. Slowly move both of your knees to one side of your body until you feel a stretch in your lower back. Try not to let your shoulders move off of the floor.  4. Hold for __________ seconds.  5. Tense your abdominal muscles and slowly move your knees back to the starting position.  6. Repeat this exercise on the other side of your body.  Repeat __________ times. Complete this exercise __________ times a day.  Exercise B: Prone extension on elbows    1. Lie on your abdomen on a firm surface.  2. Prop yourself up on your elbows.  3. Use your arms to help lift your chest up until you feel a gentle stretch in your abdomen and your lower back.  ? This will place some of your body weight on your elbows. If this is uncomfortable, try stacking pillows under your chest.  ? Your hips should stay down, against the surface that you are lying on. Keep your hip and back muscles relaxed.  4. Hold for __________ seconds.  5. Slowly relax your upper body and return to the starting position.  Repeat __________ times. Complete this exercise __________ times a day.  Strengthening exercises  These  exercises build strength and endurance in your back. Endurance is the ability to use your muscles for a long time, even after they get tired.  Exercise C: Pelvic tilt  1. Lie on your back on a firm surface. Bend your knees and keep your feet flat.  2. Tense your abdominal muscles. Tip your pelvis up toward the ceiling and flatten your lower back into the floor.  ? To help with this exercise, you may place a small towel under your lower back and try to push your back into the towel.  3. Hold for __________ seconds.  4. Let your muscles relax completely before you repeat this exercise.  Repeat __________ times. Complete this exercise __________ times a day.  Exercise D: Alternating arm and leg raises    1. Get on your hands and knees on a firm surface. If you are on a hard floor, you may want to use padding to cushion your knees, such as an exercise mat.  2. Line up your arms and legs. Your hands should be below your shoulders, and your knees should be below your hips.  3. Lift your left leg behind you. At the same time, raise your right arm and straighten it in front of you.  ? Do not lift your leg higher than your hip.  ? Do not lift your arm   higher than your shoulder.  ? Keep your abdominal and back muscles tight.  ? Keep your hips facing the ground.  ? Do not arch your back.  ? Keep your balance carefully, and do not hold your breath.  4. Hold for __________ seconds.  5. Slowly return to the starting position and repeat with your right leg and your left arm.  Repeat __________ times. Complete this exercise __________ times a day.  Exercise E: Abdominal set with straight leg raise    1. Lie on your back on a firm surface.  2. Bend one of your knees and keep your other leg straight.  3. Tense your abdominal muscles and lift your straight leg up, 4-6 inches (10-15 cm) off the ground.  4. Keep your abdominal muscles tight and hold for __________ seconds.  ? Do not hold your breath.  ? Do not arch your back. Keep it  flat against the ground.  5. Keep your abdominal muscles tense as you slowly lower your leg back to the starting position.  6. Repeat with your other leg.  Repeat __________ times. Complete this exercise __________ times a day.  Posture and body mechanics    Body mechanics refers to the movements and positions of your body while you do your daily activities. Posture is part of body mechanics. Good posture and healthy body mechanics can help to relieve stress in your body's tissues and joints. Good posture means that your spine is in its natural S-curve position (your spine is neutral), your shoulders are pulled back slightly, and your head is not tipped forward. The following are general guidelines for applying improved posture and body mechanics to your everyday activities.  Standing    · When standing, keep your spine neutral and your feet about hip-width apart. Keep a slight bend in your knees. Your ears, shoulders, and hips should line up.  · When you do a task in which you stand in one place for a long time, place one foot up on a stable object that is 2-4 inches (5-10 cm) high, such as a footstool. This helps keep your spine neutral.  Sitting    · When sitting, keep your spine neutral and keep your feet flat on the floor. Use a footrest, if necessary, and keep your thighs parallel to the floor. Avoid rounding your shoulders, and avoid tilting your head forward.  · When working at a desk or a computer, keep your desk at a height where your hands are slightly lower than your elbows. Slide your chair under your desk so you are close enough to maintain good posture.  · When working at a computer, place your monitor at a height where you are looking straight ahead and you do not have to tilt your head forward or downward to look at the screen.  Resting    · When lying down and resting, avoid positions that are most painful for you.  · If you have pain with activities such as sitting, bending, stooping, or squatting  (flexion-based activities), lie in a position in which your body does not bend very much. For example, avoid curling up on your side with your arms and knees near your chest (fetal position).  · If you have pain with activities such as standing for a long time or reaching with your arms (extension-based activities), lie with your spine in a neutral position and bend your knees slightly. Try the following positions:  · Lying on your side with a   pillow between your knees.  · Lying on your back with a pillow under your knees.  Lifting    · When lifting objects, keep your feet at least shoulder-width apart and tighten your abdominal muscles.  · Bend your knees and hips and keep your spine neutral. It is important to lift using the strength of your legs, not your back. Do not lock your knees straight out.  · Always ask for help to lift heavy or awkward objects.  This information is not intended to replace advice given to you by your health care provider. Make sure you discuss any questions you have with your health care provider.  Document Released: 10/07/2005 Document Revised: 06/13/2016 Document Reviewed: 07/19/2015  Elsevier Interactive Patient Education © 2019 [employer]

## 2019-04-14 NOTE — Progress Notes (Signed)
    Subjective:  Anita [name] is a 18 y.o. female who presents to the [facility] today with a chief complaint of allergic reaction and back pain.   HPI:  Patient states that she has been having an allergic reaction recently.  She states that every time she goes into work that she breaks out into hives and is very itchy.  She has been taking over-the-counter Benadryl for this.  She has not had any lip, tongue, throat swelling.  She has noticed associated shortness of breath.  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 She thinks that may be due to flour but is not certain. She has no issues when eating gluten containing foods. She does not have an epi pen at home.   She has a separate problem that she would like to talk about today.  She has had longstanding lower back pain on the left side.  This is been intermittent.  It is worse when she has to lift up heavy equipment at work.  She regularly has to pick up 40 pound bags of cheese.  She has been trying to do some home stretches with some relief.  She has no bowel, or bladder incontinence.  She has no numbness or weakness.  The pain does not radiate.    ROS: Per HPI  Social Hx: She reports that she has never smoked. She has never used smokeless tobacco. She reports current alcohol use. She reports that she does not use drugs.   CC, SH/smoking status, and VS noted  Objective:  Physical Exam: BP 116/68   Pulse 86   LMP 03/29/2019 (Exact Date)   SpO2 98%   Gen: NAD, resting comfortably  HEENT: [place], AT. No lip swelling. Pulm: NWOB Skin: warm, dry. No rashes Neuro: grossly normal, moves all extremities Psych: Normal affect and thought content   Assessment/Plan:  Allergic reaction Uncertain trigger.  Thankfully this reaction seems mild only without any signs of anaphylaxis.  However since she is unable to avoid her place of work she may continue to have increased  exposure and possible sensitization.  Discussed this with patient and she would prefer to have an EpiPen available in case she needs it.  Also offered referral to allergy for patch testing and possible desensitization.  Patient in agreement.  Chronic left-sided low back pain without sciatica With chronic back pain likely in the setting of overuse and obesity.  She has no red flag symptoms on history or exam.  Given home exercises.    Orders Placed This Encounter  Procedures  . Ambulatory referral to Allergy    Referral Priority:   Routine    Referral Type:   Allergy Testing    Referral Reason:   Specialty Services Required    Requested Specialty:   Allergy    Number of Visits Requested:   1    Meds ordered this encounter  Medications  . EPINEPHrine 0.3 mg/0.3 mL IJ SOAJ injection    Sig: Inject 0.3 mLs (0.3 mg total) into the muscle as needed for anaphylaxis.    Dispense:  1 each    Refill:  Shady Dale, DO PGY-3, [facility] 04/14/2019 2:36 PM

## 2019-04-14 NOTE — Assessment & Plan Note (Signed)
With chronic back pain likely in the setting of overuse and obesity.  She has no red flag symptoms on history or exam.  Given home exercises.

## 2019-04-29 ENCOUNTER — Other Ambulatory Visit: Payer: Self-pay

## 2019-04-29 ENCOUNTER — Ambulatory Visit (INDEPENDENT_AMBULATORY_CARE_PROVIDER_SITE_OTHER): Payer: Medicaid Other | Admitting: Allergy & Immunology

## 2019-04-29 ENCOUNTER — Encounter: Payer: Self-pay | Admitting: Allergy & Immunology

## 2019-04-29 VITALS — BP 110/68 | HR 86 | Temp 97.2°F | Resp 16 | Ht 64.0 in | Wt 270.0 lb

## 2019-04-29 DIAGNOSIS — J3089 Other allergic rhinitis: Secondary | ICD-10-CM

## 2019-04-29 DIAGNOSIS — L508 Other urticaria: Secondary | ICD-10-CM | POA: Diagnosis not present

## 2019-04-29 DIAGNOSIS — T781XXD Other adverse food reactions, not elsewhere classified, subsequent encounter: Secondary | ICD-10-CM

## 2019-04-29 DIAGNOSIS — J302 Other seasonal allergic rhinitis: Secondary | ICD-10-CM | POA: Diagnosis not present

## 2019-04-29 DIAGNOSIS — T781XXA Other adverse food reactions, not elsewhere classified, initial encounter: Secondary | ICD-10-CM | POA: Insufficient documentation

## 2019-04-29 NOTE — Progress Notes (Signed)
NEW PATIENT  Date of Service/Encounter:  04/29/19  Referring provider: Latrelle DodrillMcIntyre, Brittany J, MD   Assessment:   Adverse food reaction - with negative testing to the most common foods today  Perennial and seasonal allergic rhinitis (horse, grasses, weeds, trees, indoor molds and dust mites)  Chronic urticaria  Plan/Recommendations:   1. Adverse food reaction - Testing was negative to all of the most common food allergens, including wheat. - We are going to get some blood work to confirm this. - We are also going to look for elevated mast cell activity with a serum tryptase and look for a red meat sensitivity with an alpha gal panel.  - We are going to get some labs to look for autoimmune causes of your symptoms as well (in particular the hives).  - The sensitization to dust mites is interesting, as there are case reports of anaphylaxis from mite infected flours (abstracts printed and provided today). 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 - You could be reacting to the increased dust mite exposure at your workplace as well.  - EpiPen training reviewed. - Anaphylaxis management plan provided.  2. Perennial allergic rhinitis - Testing today showed: horse, grasses, weeds, trees, indoor molds and dust mites - Copy of test results provided.  - Avoidance measures provided. - Start taking: Zyrtec (cetirizine) 10mg  tablet twice daily (this might suppress your allergic reactions as well).  - You can use an extra dose of the antihistamine, if needed, for breakthrough symptoms.  - Consider nasal saline rinses 1-2 times daily to remove allergens from the nasal cavities as well as help with mucous clearance (this is especially helpful to do before the nasal sprays are given) - Consider allergy shots as a means of long-term control. - Allergy shots "re-train" and "reset" the immune system to ignore environmental allergens and decrease the resulting immune response to  those allergens (sneezing, itchy watery eyes, runny nose, nasal congestion, etc).    - Allergy shots improve symptoms in 75-85% of patients.  - We can discuss more at the next appointment if the medications are not working for you.  3. Return in about 6 weeks (around 06/10/2019). This can be an in-person, a virtual Webex or a telephone follow up visit.   Subjective:   Anita [name] is a 18 y.o. female presenting today for evaluation of  Chief Complaint  Patient presents with  . Allergic Reaction    hives, fatigue and believes its from the flour at her job     Anita [name] has a history of the following: Patient Active Problem List   Diagnosis Date Noted  . Adverse food reaction 04/29/2019  . Seasonal and perennial allergic rhinitis 04/29/2019  . Chronic urticaria 04/29/2019  . Allergic reaction 04/14/2019  . Chronic left-sided low back pain without sciatica 04/14/2019  . Ganglion, left wrist 07/02/2018  . Obesity 10/16/2016  . Bipolar disorder (HCC)     History obtained from: chart review and patient.  Anita [name] was referred by Latrelle DodrillMcIntyre, Brittany J, MD.     Anita [name] is a 18 y.o. female presenting for an evaluation of allergic reactions.  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 She reports that over time she becomes itchy over her entire body and she has hives. They are not "super giant" hives but they are "there". She also has problems forming sentences but this is not from throat swelling but instead forming words/sentences. She was given  an EpiPen.   When she leaves the pizza place, symptoms resolve over the course of one hour. She reports that her speech gets better and the itchiness resolves. She still is able to make pizzas and do whatever else her job entails, but the brain fog makes it worse. She is working at [employer] [address].   She thinks that this is due to a gluten allergy. She does not eat it too much. She eats tacos a lot but she  cannot eat a flour tortilla because of severe abdominal pain. She can corn tacos without a problem.   Prior to this, she worked in a [employer] and loved it. This was shut down however during the COVID19 pandemic.    Otherwise, there is no history of other atopic diseases, including asthma, drug allergies, stinging insect allergies, eczema or contact dermatitis. There is no significant infectious history. Vaccinations are up to date.    Past Medical History: Patient Active Problem List   Diagnosis Date Noted  . Adverse food reaction 04/29/2019  . Seasonal and perennial allergic rhinitis 04/29/2019  . Chronic urticaria 04/29/2019  . Allergic reaction 04/14/2019  . Chronic left-sided low back pain without sciatica 04/14/2019  . Ganglion, left wrist 07/02/2018  . Obesity 10/16/2016  . Bipolar disorder [facility](HCC)     Medication List:  Allergies as of 04/29/2019   No Known Allergies     Medication List       Accurate as of April 29, 2019 12:06 PM. If you have any questions, ask your nurse or doctor.        EPINEPHrine 0.3 mg/0.3 mL Soaj injection Commonly known as: EPI-PEN Inject 0.3 mLs (0.3 mg total) into the muscle as needed for anaphylaxis.   ibuprofen 800 MG tablet Commonly known as: ADVIL Take 1 tablet (800 mg total) by mouth 3 (three) times daily.   Melatonin 10 MG Tabs Take 1 tablet by mouth daily.       Birth History: non-contributory  Developmental History: non-contributory  Past Surgical History: Past Surgical History:  Procedure Laterality Date  . KNEE SURGERY  01/23/2015   L knee torn meniscus and ACL  . TONSILLECTOMY AND ADENOIDECTOMY  2012     Family History: Family History  Problem Relation Age of Onset  . Alcohol abuse Mother   . Depression Mother   . Thyroid disease Mother   . Heart attack Mother [id number]       died of heart attack  . Bipolar disorder Mother   . Asthma Mother   . Vision loss Sister   . Asthma Father        as a child  .  Hypertension Maternal Grandfather      Social History: Anita [name] lives at home in an apartment.  She is unsure of the age of it.  There is carpeting throughout the apartment.  She has electric heating and central cooling.  There are cats and dogs in the home.  There are no dust mite covers on the bedding.  There is no tobacco exposure in the car or the house.  However, she does smoke about 1 pack every 2 weeks.  She currently works at a [employer] since March 2020.  Prior to that she worked at a [employer].  Review of Systems  Constitutional: Negative.  Negative for chills, fever, malaise/fatigue and weight loss.  HENT: Negative.  Negative for congestion, ear discharge and ear pain.   Eyes: Negative for pain, discharge and redness.  Respiratory: Negative for cough, sputum production, shortness of breath and wheezing.   Cardiovascular: Negative.  Negative for chest pain and palpitations.  Gastrointestinal: Negative for abdominal pain, constipation, diarrhea, heartburn, nausea and vomiting.  Skin: Positive for itching and rash.  Neurological: Negative for dizziness and headaches.  Endo/Heme/Allergies: Positive for environmental allergies. Does not bruise/bleed easily.       Objective:   Blood pressure 110/68, pulse 86, temperature (!) 97.2 F (36.2 C), resp. rate 16, height  (1.626 m), weight 270 lb (122.5 kg), SpO2 97 %. Body mass index is 46.35 kg/m.   Physical Exam:   Physical Exam  Constitutional: She appears well-developed.  Pleasant female. Obese.   HENT:  Head: Normocephalic and atraumatic.  Right Ear: Tympanic membrane, external ear and ear canal normal. No drainage, swelling or tenderness. Tympanic membrane is not injected, not scarred, not erythematous, not retracted and not bulging.  Left Ear: Tympanic membrane, external ear and ear canal normal. No drainage, swelling or tenderness. Tympanic membrane is not injected, not scarred, not erythematous, not retracted and  not bulging.  Nose: Mucosal edema and rhinorrhea present. No nasal deformity or septal deviation. No epistaxis. Right sinus exhibits no maxillary sinus tenderness and no frontal sinus tenderness. Left sinus exhibits no maxillary sinus tenderness and no frontal sinus tenderness.  Mouth/Throat: Uvula is midline and oropharynx is clear and moist. Mucous membranes are not pale and not dry.  There is some postnasal drip present. There is cobblestoning present in the posterior oropharynx.   Eyes: Pupils are equal, round, and reactive to light. Conjunctivae and EOM are normal. Right eye exhibits no chemosis and no discharge. Left eye exhibits no chemosis and no discharge. Right conjunctiva is not injected. Left conjunctiva is not injected.  Cardiovascular: Normal rate, regular rhythm and normal heart sounds.  Respiratory: Effort normal and breath sounds normal. No accessory muscle usage. No tachypnea. No respiratory distress. She has no wheezes. She has no rhonchi. She has no rales. She exhibits no tenderness.  Moving air well in all lung fields.   GI: There is no abdominal tenderness. There is no rebound and no guarding.  Lymphadenopathy:       Head (right side): No submandibular, no tonsillar and no occipital adenopathy present.       Head (left side): No submandibular, no tonsillar and no occipital adenopathy present.    She has no cervical adenopathy.  Neurological: She is alert.  Skin: No abrasion, no petechiae and no rash noted. Rash is not papular, not vesicular and not urticarial. No erythema. No pallor.  No eczematous or urticarial lesions noted whatsoever.   Psychiatric: She has a normal mood and affect.     Diagnostic studies:     Allergy Studies:    Airborne Adult Perc - 04/29/19 1055    Time Antigen Placed  1015    Allergen Manufacturer  Waynette Buttery    Location  Back    Number of Test  59    Panel 1  Select    1. Control-Buffer 50% Glycerol  Negative    2. Control-Histamine 1 mg/ml  2+     3. Albumin saline  Negative    4. Bahia  Negative    5. French Southern Territories  Negative    6. Johnson  Negative    7. Kentucky Blue  Negative    8. Meadow Fescue  Negative    9. Perennial Rye  Negative    10. Sweet Vernal  Negative    11.  Timothy  Negative    12. Cocklebur  Negative    13. Burweed Marshelder  Negative    14. Ragweed, short  Negative    15. Ragweed, Giant  Negative    16. Plantain,  English  Negative    17. Lamb's Quarters  Negative    18. Sheep Sorrell  Negative    19. Rough Pigweed  Negative    20. Marsh Elder, Rough  Negative    21. Mugwort, Common  Negative    22. Ash mix  Negative    23. Birch mix  Negative    24. Beech American  Negative    25. Box, Elder  Negative    26. Cedar, red  Negative    27. Cottonwood, Guinea-Biss[place]astern  Negative    28. Elm mix  Negative    29. Hickory mix  Negative    30. Maple mix  Negative    31. Oak, Guinea-Biss[place]astern mix  Negative    32. Pecan Pollen  Negative    33. Pine mix  Negative    34. Sycamore Eastern  Negative    35. Walnut, Black Pollen  Negative    36. Alternaria alternata  Negative    37. Cladosporium Herbarum  Negative    38. Aspergillus mix  Negative    39. Penicillium mix  Negative    40. Bipolaris sorokiniana (Helminthosporium)  Negative    41. Drechslera spicifera (Curvularia)  Negative    42. Mucor plumbeus  Negative    43. Fusarium moniliforme  Negative    44. Aureobasidium pullulans (pullulara)  Negative    45. Rhizopus oryzae  Negative    46. Botrytis cinera  Negative    47. Epicoccum nigrum  Negative    48. Phoma betae  Negative    49. Candida Albicans  Negative    50. Trichophyton mentagrophytes  Negative    51. Mite, D Farinae  5,000 AU/ml  4+    52. Mite, D Pteronyssinus  5,000 AU/ml  4+    53. Cat Hair 10,000 BAU/ml  Negative    54.  Dog Epithelia  Negative    55. Mixed Feathers  Negative    56. Horse Epithelia  --   +/-    7157. Cockroach, German  Negative    58. Mouse  Negative    59. Tobacco Leaf  Negative      Food Perc - 04/29/19 1055    Time Antigen Placed  1015    Allergen Manufacturer  Waynette ButteryGreer    Location  Back    Number of allergen test  10    Food  Select    1. Peanut  Negative    2. Soybean food  Negative    3. Wheat, whole  Negative    4. Sesame  Negative    5. Milk, cow  Negative    6. Egg White, chicken  Negative    7. Casein  Negative    8. Shellfish mix  Negative    9. Fish mix  Negative    10. Cashew  Negative     Intradermal - 04/29/19 1056    Time Antigen Placed  1031    Allergen Manufacturer  Greer    Location  Arm    Number of Test  14    Intradermal  Select    Control  Negative    French Southern Territories[place]  1+    Johnson  Negative    7 Grass  Negative  Ragweed mix  Negative    Weed mix  2+    Tree mix  2+    Mold 1  Negative    Mold 2  2+    Mold 3  Negative    Mold 4  2+    Cat  Negative    Dog  Negative    Cockroach  Negative       Allergy testing results were read and interpreted by myself, documented by clinical staff.         Salvatore Marvel, MD [facility] [name]

## 2019-04-29 NOTE — Patient Instructions (Addendum)
1. Adverse food reaction - Testing was negative to all of the most common food allergens, including wheat. - We are going to get some blood work to confirm this. - We are also going to look for elevated mast cell activity with a serum tryptase and look for a red meat sensitivity with an alpha gal panel.  - We are going to get some labs to look for autoimmune causes of your symptoms as well (in particular the hives).  - The sensitization to dust mites is interesting, as there are case reports of anaphylaxis from mite infected flours (abstracts printed and provided today). 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 - You could be reacting to the increased dust mite exposure at your workplace as well.  - EpiPen training reviewed. - Anaphylaxis management plan provided.  2. Perennial allergic rhinitis - Testing today showed: horse, grasses, weeds, trees, indoor molds and dust mites - Copy of test results provided.  - Avoidance measures provided. - Start taking: Zyrtec (cetirizine) 10mg  tablet twice daily (this might suppress your allergic reactions as well).  - You can use an extra dose of the antihistamine, if needed, for breakthrough symptoms.  - Consider nasal saline rinses 1-2 times daily to remove allergens from the nasal cavities as well as help with mucous clearance (this is especially helpful to do before the nasal sprays are given) - Consider allergy shots as a means of long-term control. - Allergy shots "re-train" and "reset" the immune system to ignore environmental allergens and decrease the resulting immune response to those allergens (sneezing, itchy watery eyes, runny nose, nasal congestion, etc).    - Allergy shots improve symptoms in 75-85% of patients.  - We can discuss more at the next appointment if the medications are not working for you.  3. Return in about 6 weeks (around 06/10/2019). This can be an in-person, a virtual Webex or a telephone follow up  visit.   Please inform us of any Emergency Department visits, hospitalizations, or changes in symptoms. Call us before going to the ED for breathing or allergy symptoms since we might be able to fit you in for a sick visit. Feel free to contact us anytime with any questions, problems, or concerns.  It was a pleasure to meet you today!  Websites that have reliable patient information: 1. American Academy of Asthma, Allergy, and Immunology: www.aaaai.org 2. Food Allergy Research and Education (FARE): foodallergy.org 3. Mothers of Asthmatics: http://www.asthmacommunitynetwork.org 4. American College of Allergy, Asthma, and Immunology: www.acaai.org  "Like" us on Facebook and Instagram for our latest updates!      Make sure you are registered to vote! If you have moved or changed any of your contact information, you will need to get this updated before voting!  In some cases, you MAY be able to register to vote online: [email or web address]    Voter ID laws are NOT going into effect for the General Election in November 2020! DO NOT let this stop you from exercising your right to vote!   Absentee voting is the SAFEST way to vote during the coronavirus pandemic!   Download and print an absentee ballot request form at [email or web address] or you can scan the QR code below with your smart phone:      More information on absentee ballots can be found here: [email or web address]  Reducing Pollen Exposure  The American Academy of Allergy, Asthma and Immunology suggests the following steps to reduce your exposure to pollen during allergy seasons.  1. Do not hang sheets or clothing out to dry; pollen may collect on these items. 2. Do not mow lawns or spend time around freshly cut grass; mowing stirs up pollen. 3. Keep windows closed at night.  Keep car windows closed while driving. 4. Minimize morning activities outdoors, a time when pollen  counts are usually at their highest. 5. Stay indoors as much as possible when pollen counts or humidity is high and on windy days when pollen tends to remain in the air longer. 6. Use air conditioning when possible.  Many air conditioners have filters that trap the pollen spores. 7. Use a HEPA room air filter to remove pollen form the indoor air you breathe.   Control of House Dust Mite Allergen    House dust mites play a major role in allergic asthma and rhinitis.  They occur in environments with high humidity wherever human skin, the food for dust mites is found. High levels have been detected in dust obtained from mattresses, pillows, carpets, upholstered furniture, bed covers, clothes and soft toys.  The principal allergen of the house dust mite is found in its feces.  A gram of dust may contain 1,000 mites and 250,000 fecal particles.  Mite antigen is easily measured in the air during house cleaning activities.    1. Encase mattresses, including the box spring, and pillow, in an air tight cover.  Seal the zipper end of the encased mattresses with wide adhesive tape. 2. Wash the bedding in water of 130 degrees Farenheit weekly.  Avoid cotton comforters/quilts and flannel bedding: the most ideal bed covering is the dacron comforter. 3. Remove all upholstered furniture from the bedroom. 4. Remove carpets, carpet padding, rugs, and non-washable window drapes from the bedroom.  Wash drapes weekly or use plastic window coverings. 5. Remove all non-washable stuffed toys from the bedroom.  Wash stuffed toys weekly. 6. Have the room cleaned frequently with a vacuum cleaner and a damp dust-mop.  The patient should not be in a room which is being cleaned and should wait 1 hour after cleaning before going into the room. 7. Close and seal all heating outlets in the bedroom.  Otherwise, the room will become filled with dust-laden air.  An electric heater can be used to heat the room. 8. Reduce indoor  humidity to less than 50%.  Do not use a humidifier.  Control of Mold Allergen   Mold and fungi can grow on a variety of surfaces provided certain temperature and moisture conditions exist.  Outdoor molds grow on plants, decaying vegetation and soil.  The major outdoor mold, Alternaria and Cladosporium, are found in very high numbers during hot and dry conditions.  Generally, a late Summer - Fall peak is seen for common outdoor fungal spores.  Rain will temporarily lower outdoor mold spore count, but counts rise rapidly when the rainy period ends.  The most important indoor molds are Aspergillus and Penicillium.  Dark, humid and poorly ventilated basements are ideal sites for mold growth.  The next most common sites of mold growth are the bathroom and the kitchen.   Indoor (Perennial) Mold Control   Positive indoor molds via skin testing: Aspergillus, Penicillium, Fusarium, Aureobasidium (Pullulara) and Rhizopus  1. Maintain humidity below 50%. 2. Clean washable surfaces with 5% bleach solution. 3. Remove sources e.g. contaminated carpets.    Allergy Shots   Allergies are the result of a chain reaction that starts in the immune system. Your immune system controls how your  body defends itself. For instance, if you have an allergy to pollen, your immune system identifies pollen as an invader or allergen. Your immune system overreacts by producing antibodies called Immunoglobulin E (IgE). These antibodies travel to cells that release chemicals, causing an allergic reaction.  The concept behind allergy immunotherapy, whether it is received in the form of shots or tablets, is that the immune system can be desensitized to specific allergens that trigger allergy symptoms. Although it requires time and patience, the payback can be long-term relief.  How Do Allergy Shots Work?  Allergy shots work much like a vaccine. Your body responds to injected amounts of a particular allergen given in increasing  doses, eventually developing a resistance and tolerance to it. Allergy shots can lead to decreased, minimal or no allergy symptoms.  There generally are two phases: build-up and maintenance. Build-up often ranges from three to six months and involves receiving injections with increasing amounts of the allergens. The shots are typically given once or twice a week, though more rapid build-up schedules are sometimes used.  The maintenance phase begins when the most effective dose is reached. This dose is different for each person, depending on how allergic you are and your response to the build-up injections. Once the maintenance dose is reached, there are longer periods between injections, typically two to four weeks.  Occasionally doctors give cortisone-type shots that can temporarily reduce allergy symptoms. These types of shots are different and should not be confused with allergy immunotherapy shots.  Who Can Be Treated with Allergy Shots?  Allergy shots may be a good treatment approach for people with allergic rhinitis (hay fever), allergic asthma, conjunctivitis (eye allergy) or stinging insect allergy.   Before deciding to begin allergy shots, you should consider:  . The length of allergy season and the severity of your symptoms . Whether medications and/or changes to your environment can control your symptoms . Your desire to avoid long-term medication use . Time: allergy immunotherapy requires a major time commitment . Cost: may vary depending on your insurance coverage  Allergy shots for children age 48 and older are effective and often well tolerated. They might prevent the onset of new allergen sensitivities or the progression to asthma.  Allergy shots are not started on patients who are pregnant but can be continued on patients who become pregnant while receiving them. In some patients with other medical conditions or who take certain common medications, allergy shots may be of  risk. It is important to mention other medications you talk to your allergist.   When Will I Feel Better?  Some may experience decreased allergy symptoms during the build-up phase. For others, it may take as long as 12 months on the maintenance dose. If there is no improvement after a year of maintenance, your allergist will discuss other treatment options with you.  If you aren't responding to allergy shots, it may be because there is not enough dose of the allergen in your vaccine or there are missing allergens that were not identified during your allergy testing. Other reasons could be that there are high levels of the allergen in your environment or major exposure to non-allergic triggers like tobacco smoke.  What Is the Length of Treatment?  Once the maintenance dose is reached, allergy shots are generally continued for three to five years. The decision to stop should be discussed with your allergist at that time. Some people may experience a permanent reduction of allergy symptoms. Others may relapse and a  longer course of allergy shots can be considered.  What Are the Possible Reactions?  The two types of adverse reactions that can occur with allergy shots are local and systemic. Common local reactions include very mild redness and swelling at the injection site, which can happen immediately or several hours after. A systemic reaction, which is less common, affects the entire body or a particular body system. They are usually mild and typically respond quickly to medications. Signs include increased allergy symptoms such as sneezing, a stuffy nose or hives.  Rarely, a serious systemic reaction called anaphylaxis can develop. Symptoms include swelling in the throat, wheezing, a feeling of tightness in the chest, nausea or dizziness. Most serious systemic reactions develop within 30 minutes of allergy shots. This is why it is strongly recommended you wait in your doctor's office for 30 minutes  after your injections. Your allergist is trained to watch for reactions, and his or her staff is trained and equipped with the proper medications to identify and treat them.  Who Should Administer Allergy Shots?  The preferred location for receiving shots is your prescribing allergist's office. Injections can sometimes be given at another facility where the physician and staff are trained to recognize and treat reactions, and have received instructions by your prescribing allergist.

## 2019-05-06 LAB — ALPHA-GAL PANEL
Alpha Gal IgE*: <0.1 kU/L (ref <0.10)
Beef (Bos spp) IgE: <0.1 kU/L (ref <0.35)
Class Interpretation: 0
Class Interpretation: 0
Class Interpretation: 0
Lamb/Mutton (Ovis spp) IgE: <0.1 kU/L (ref <0.35)
Pork (Sus spp) IgE: <0.1 kU/L (ref <0.35)

## 2019-05-06 LAB — TRYPTASE: Tryptase: 4.2 ug/L (ref 2.2–13.2)

## 2019-05-06 LAB — ALLERGEN, WHEAT, F4: Wheat IgE: <0.1 kU/L

## 2019-05-07 ENCOUNTER — Telehealth: Payer: Self-pay

## 2019-05-07 LAB — CMP14+EGFR
ALT: 7 IU/L (ref 0–32)
AST: 14 IU/L (ref 0–40)
Albumin/Globulin Ratio: 2 (ref 1.2–2.2)
Albumin: 4.4 g/dL (ref 3.9–5.0)
Alkaline Phosphatase: 64 IU/L (ref 39–117)
BUN/Creatinine Ratio: 10 (ref 9–23)
BUN: 6 mg/dL (ref 6–20)
Bilirubin Total: 0.5 mg/dL (ref 0.0–1.2)
CO2: 25 mmol/L (ref 20–29)
Calcium: 9.3 mg/dL (ref 8.7–10.2)
Chloride: 104 mmol/L (ref 96–106)
Creatinine, Ser: 0.58 mg/dL (ref 0.57–1.00)
GFR calc Af Amer: 153 mL/min/{1.73_m2} (ref >59)
GFR calc non Af Amer: 133 mL/min/{1.73_m2} (ref >59)
Globulin, Total: 2.2 g/dL (ref 1.5–4.5)
Glucose: 87 mg/dL (ref 65–99)
Potassium: 3.9 mmol/L (ref 3.5–5.2)
Sodium: 140 mmol/L (ref 134–144)
Total Protein: 6.6 g/dL (ref 6.0–8.5)

## 2019-05-07 LAB — SEDIMENTATION RATE: Sed Rate: 17 mm/hr (ref 0–32)

## 2019-05-07 LAB — CHRONIC URTICARIA: cu index: 7.8 (ref <10)

## 2019-05-07 LAB — THYROID ANTIBODIES
Thyroglobulin Antibody: <1 IU/mL (ref 0.0–0.9)
Thyroperoxidase Ab SerPl-aCnc: 10 IU/mL (ref 0–34)

## 2019-05-07 LAB — ANA W/REFLEX IF POSITIVE: Anti Nuclear Antibody (ANA): NEGATIVE

## 2019-05-07 LAB — C-REACTIVE PROTEIN: CRP: 2 mg/L (ref 0–10)

## 2019-05-07 NOTE — Telephone Encounter (Signed)
Anita [name] has spoken with the patient and given lab results.

## 2019-05-07 NOTE — Telephone Encounter (Signed)
Patient returning call about lab results.  

## 2019-05-10 ENCOUNTER — Ambulatory Visit: Payer: Medicaid Other | Admitting: Family Medicine

## 2019-05-11 ENCOUNTER — Other Ambulatory Visit: Payer: Self-pay

## 2019-05-11 ENCOUNTER — Ambulatory Visit (INDEPENDENT_AMBULATORY_CARE_PROVIDER_SITE_OTHER): Payer: Medicaid Other | Admitting: Family Medicine

## 2019-05-11 ENCOUNTER — Encounter: Payer: Self-pay | Admitting: Family Medicine

## 2019-05-11 VITALS — BP 100/60 | HR 88

## 2019-05-11 DIAGNOSIS — Z309 Encounter for contraceptive management, unspecified: Secondary | ICD-10-CM | POA: Diagnosis not present

## 2019-05-11 DIAGNOSIS — Z3009 Encounter for other general counseling and advice on contraception: Secondary | ICD-10-CM

## 2019-05-11 DIAGNOSIS — Z30017 Encounter for initial prescription of implantable subdermal contraceptive: Secondary | ICD-10-CM | POA: Insufficient documentation

## 2019-05-11 LAB — POCT URINE PREGNANCY: Preg Test, Ur: NEGATIVE

## 2019-05-11 NOTE — Assessment & Plan Note (Signed)
Discussed various forms of birth control with patient.  Initially stated she would like birth control pills.  After discussing other forms of birth control she has decided on the Nexplanon.  Patient states that she likes that it will last for 3 years.  Patient has used lidocaine in the past for dental procedures without difficulty.  We will follow-up in 2 days to have Nexplanon inserted.  Strict return precautions given.  Advised to not have unprotected sexual intercourse between now and insertion as she can become pregnant in between.  We will need a urine pregnancy test on date of Insertion.  Follow-up in 2 days.

## 2019-05-11 NOTE — Progress Notes (Signed)
   Subjective:    Patient ID: Anita [name], female    DOB: 2001/08/30, 18 y.o.   MRN: [id number]   CC: discuss contraception  HPI: Discuss contraception Patient presenting today to discuss contraception.  States that she would like birth control pills as she is moving to [place] in 9 days.  Says she has tried Depakote in the past and she does not like it as she does not like to have to get shots every 3 months.  Did not have a period while she was on Depakote.  Has been off of Depakote since 2018.  Is not currently sexually active but is planning on becoming sexually active so would like protection before him.  Last intercourse was March 2019.  LMP July 5-9.  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  Denies any history of blood clots in herself.  Is unsure about family history.  Doesbleeding cigarettes, also vapes.  Patient states she would not like to try the IUD as her aunt had an IUD and had a bad experience.   Objective:  BP 100/60   Pulse 88   SpO2 98%  Vitals and nursing note reviewed  General: well nourished, in no acute distress HEENT: normocephalic, moist mucous membranes Cardiac: RRR, clear S1 and S2, no murmurs, rubs, or gallops Respiratory: clear to auscultation bilaterally, no increased work of breathing Abdomen: soft, nontender, nondistended, no masses or organomegaly. Bowel sounds present Extremities: no edema or cyanosis. Skin: warm and dry, no rashes noted Neuro: alert and oriented, no focal deficits   Assessment & Plan:    Contraception management Discussed various forms of birth control with patient.  Initially stated she would like birth control pills.  After discussing other forms of birth control she has decided on the Nexplanon.  Patient states that she likes that it will last for 3 years.  Patient has used lidocaine in the past for dental procedures without difficulty.  We will follow-up in 2 days to have Nexplanon inserted.  Strict return precautions  given.  Advised to not have unprotected sexual intercourse between now and insertion as she can become pregnant in between.  We will need a urine pregnancy test on date of Insertion.  Follow-up in 2 days.    Return in about 2 days (around 05/13/2019) for Nexplanon insertion.   Caroline More, DO, PGY-3

## 2019-05-11 NOTE — Patient Instructions (Signed)
Etonogestrel implant What is this medicine? ETONOGESTREL (et oh noe JES trel) is a contraceptive (birth control) device. It is used to prevent pregnancy. It can be used for up to 3 years. This medicine may be used for other purposes; ask your health care provider or pharmacist if you have questions. COMMON BRAND NAME(S): Implanon, Nexplanon What should I tell my health care provider before I take this medicine? They need to know if you have any of these conditions:  abnormal vaginal bleeding  blood vessel disease or blood clots  breast, cervical, endometrial, ovarian, liver, or uterine cancer  diabetes  gallbladder disease  heart disease or recent heart attack  high blood pressure  high cholesterol or triglycerides  kidney disease  liver disease  migraine headaches  seizures  stroke  tobacco smoker  an unusual or allergic reaction to etonogestrel, anesthetics or antiseptics, other medicines, foods, dyes, or preservatives  pregnant or trying to get pregnant  breast-feeding How should I use this medicine? This device is inserted just under the skin on the inner side of your upper arm by a health care professional. Talk to your pediatrician regarding the use of this medicine in children. Special care may be needed. Overdosage: If you think you have taken too much of this medicine contact a poison control center or emergency room at once. NOTE: This medicine is only for you. Do not share this medicine with others. What if I miss a dose? This does not apply. What may interact with this medicine? Do not take this medicine with any of the following medications:  amprenavir  fosamprenavir This medicine may also interact with the following medications:  acitretin  aprepitant  armodafinil  bexarotene  bosentan  carbamazepine  certain medicines for fungal infections like fluconazole, ketoconazole, itraconazole and voriconazole  certain medicines to treat  hepatitis, HIV or AIDS  cyclosporine  felbamate  griseofulvin  lamotrigine  modafinil  oxcarbazepine  phenobarbital  phenytoin  primidone  rifabutin  rifampin  rifapentine  St. John's wort  topiramate This list may not describe all possible interactions. Give your health care provider a list of all the medicines, herbs, non-prescription drugs, or dietary supplements you use. Also tell them if you smoke, drink alcohol, or use illegal drugs. Some items may interact with your medicine. What should I watch for while using this medicine? This product does not protect you against HIV infection (AIDS) or other sexually transmitted diseases. You should be able to feel the implant by pressing your fingertips over the skin where it was inserted. Contact your doctor if you cannot feel the implant, and use a non-hormonal birth control method (such as condoms) until your doctor confirms that the implant is in place. Contact your doctor if you think that the implant may have broken or become bent while in your arm. You will receive a user card from your health care provider after the implant is inserted. The card is a record of the location of the implant in your upper arm and when it should be removed. Keep this card with your health records. What side effects may I notice from receiving this medicine? Side effects that you should report to your doctor or health care professional as soon as possible:  allergic reactions like skin rash, itching or hives, swelling of the face, lips, or tongue  breast lumps, breast tissue changes, or discharge  breathing problems  changes in emotions or moods  if you feel that the implant may have broken or   bent while in your arm  high blood pressure  pain, irritation, swelling, or bruising at the insertion site  scar at site of insertion  signs of infection at the insertion site such as fever, and skin redness, pain or discharge  signs and  symptoms of a blood clot such as breathing problems; changes in vision; chest pain; severe, sudden headache; pain, swelling, warmth in the leg; trouble speaking; sudden numbness or weakness of the face, arm or leg  signs and symptoms of liver injury like dark yellow or brown urine; general ill feeling or flu-like symptoms; light-colored stools; loss of appetite; nausea; right upper belly pain; unusually weak or tired; yellowing of the eyes or skin  unusual vaginal bleeding, discharge Side effects that usually do not require medical attention (report to your doctor or health care professional if they continue or are bothersome):  acne  breast pain or tenderness  headache  irregular menstrual bleeding  nausea This list may not describe all possible side effects. Call your doctor for medical advice about side effects. You may report side effects to FDA at 1-800-FDA-1088. Where should I keep my medicine? This drug is given in a hospital or clinic and will not be stored at home. NOTE: This sheet is a summary. It may not cover all possible information. If you have questions about this medicine, talk to your doctor, pharmacist, or health care provider.  2020 Elsevier/Gold Standard (2017-08-26 14:11:42)  

## 2019-05-12 NOTE — Progress Notes (Signed)
   Subjective:    Patient ID: Anita [name], female    DOB: Apr 13, 2001, 18 y.o.   MRN: [id number]   CC: Nexplanon insertion   HPI:  Anita [name] is a 18 y.o. year old Caucasian female here for Nexplanon insertion.  Her LMP was July 5-10 , and her pregnancy test today was negative.  Risks/benefits/side effects of Nexplanon have been discussed and her questions have been answered.  Specifically, a failure rate of 10/998 has been reported, with an increased failure rate if pt takes Rand and/or antiseizure medicaitons.  Anita [name] is aware of the common side effect of irregular bleeding, which the incidence of decreases over time.   Past Medical History: Past Medical History:  Diagnosis Date  . Anxiety   . Asthma    age 18  . Bipolar disorder ([place])   . Eczema   . Migraines     Past Surgical History: Past Surgical History:  Procedure Laterality Date  . KNEE SURGERY  01/23/2015   L knee torn meniscus and ACL  . TONSILLECTOMY AND ADENOIDECTOMY  2012    Family History: Family History  Problem Relation Age of Onset  . Alcohol abuse Mother   . Depression Mother   . Thyroid disease Mother   . Heart attack Mother 59       died of heart attack  . Bipolar disorder Mother   . Asthma Mother   . Vision loss Sister   . Asthma Father        as a child  . Hypertension Maternal Grandfather     Social History: Social History   Tobacco Use  . Smoking status: Light Tobacco Smoker    Types: Cigarettes  . Smokeless tobacco: Never Used  Substance Use Topics  . Alcohol use: Yes  . Drug use: No    Comment: past marijuana use, former    Allergies: No Known Allergies   Objective:  BP 110/80   Pulse 90   SpO2 99%  Vitals and nursing note reviewed  General: well nourished, in no acute distress Cardiac: Regular rate Respiratory: no increased work of breathing Extremities: no edema or cyanosis. Warm, well perfused.  Skin: warm and dry, no rashes  noted Neuro: alert and oriented, no focal deficits   Assessment & Plan:    Nexplanon insertion PROCEDURE NOTE: Nexplanon insertion Patient given informed consent, signed copy in the chart.  Appropriate time out taken  Pregnancy test was negative.  The patient's  left arm was prepped and draped in the usual sterile fashion.. The ruler used to measure and mark the insertion area 8 cm from medial epicondyle of the elbow and 4 cm below sulcus. Dr. Erin Hearing viewed the insertion site to confirm location of placement. Local anaesthesia obtained using 2 cc of 1% lidocaine without epinephrine. Nexplanon was inserted per manufacturer's directions. Less than 1 cc blood loss. The insertion site covered with band-aid and a pressure bandage to minimize bruising. There were no complications and the patient tolerated the procedure well.  Device information was given in handout form. Patient is informed the removal date will be in three years and package insert card filled out and given to her. Informed patient to use condoms with intercourse to prevent STDs. Advised to use back up contraception x2 weeks.      Return if symptoms worsen or fail to improve.   Caroline More, DO, PGY-3

## 2019-05-12 NOTE — Patient Instructions (Signed)
Nexplanon Instructions After Insertion Congratulations on getting your Nexplanon placement!  Below is some important information about Nexplanon.  First remember that Nexplanon does not prevent sexually transmitted infections.  Condoms will help prevent sexually transmitted infections. The Nexplanon starts working 7 days after it was inserted.  There is a risk of getting pregnant if you have unprotected sex in those first 7 days after placement of the Nexplanon.  The Nexplanon lasts for 3 years but can be removed at any time.  You can become pregnant as early as 1 week after removal.  You can have a new Nexplanon put in after the old one is removed if you like.  Always tell other healthcare providers that you have a Nexplanon in your arm.  The Nexplanon was placed just under the skin.  Leave the outside bandage on for 24 hours.  Leave the smaller bandage on for 3-5 days or until it falls off on its own.  Keep the area clean and dry for 3-5 days. There is usually bruising or swelling at the insertion site for a few days to a week after placement.  If you see redness or pus draining from the insertion site, call us immediately.  Keep your user card with the date the implant was placed and the date the implant is to be removed.  The most common side effect is a change in your menstrual bleeding pattern.   This bleeding is generally not harmful to you but can be annoying.  Call or come in to see [place] if you have any concerns about the bleeding or if you have any side effects or questions.

## 2019-05-13 ENCOUNTER — Other Ambulatory Visit: Payer: Self-pay

## 2019-05-13 ENCOUNTER — Ambulatory Visit (INDEPENDENT_AMBULATORY_CARE_PROVIDER_SITE_OTHER): Payer: Medicaid Other | Admitting: Family Medicine

## 2019-05-13 ENCOUNTER — Encounter: Payer: Self-pay | Admitting: Family Medicine

## 2019-05-13 VITALS — BP 110/80 | HR 90

## 2019-05-13 DIAGNOSIS — Z3046 Encounter for surveillance of implantable subdermal contraceptive: Secondary | ICD-10-CM

## 2019-05-13 DIAGNOSIS — Z30017 Encounter for initial prescription of implantable subdermal contraceptive: Secondary | ICD-10-CM | POA: Diagnosis not present

## 2019-05-13 LAB — POCT URINE PREGNANCY: Preg Test, Ur: NEGATIVE

## 2019-05-13 MED ORDER — ETONOGESTREL 68 MG ~~LOC~~ IMPL: 68.0000 mg | DRUG_IMPLANT | Freq: Once | SUBCUTANEOUS | Status: DC

## 2019-05-13 MED ORDER — ETONOGESTREL 68 MG ~~LOC~~ IMPL
68.0000 mg | DRUG_IMPLANT | Freq: Once | SUBCUTANEOUS | Status: AC
  Administered 2019-05-13: 68 mg via SUBCUTANEOUS

## 2019-05-13 NOTE — Assessment & Plan Note (Addendum)
PROCEDURE NOTE: Nexplanon insertion Patient given informed consent, signed copy in the chart.  Appropriate time out taken  Pregnancy test was negative.  The patient's  left arm was prepped and draped in the usual sterile fashion.. The ruler used to measure and mark the insertion area 8 cm from medial epicondyle of the elbow and 4 cm below sulcus. Dr. Erin Hearing viewed the insertion site to confirm location of placement. Local anaesthesia obtained using 2 cc of 1% lidocaine without epinephrine. Nexplanon was inserted per manufacturer's directions. Less than 1 cc blood loss. The insertion site covered with band-aid and a pressure bandage to minimize bruising. There were no complications and the patient tolerated the procedure well.  Device information was given in handout form. Patient is informed the removal date will be in three years and package insert card filled out and given to her. Informed patient to use condoms with intercourse to prevent STDs. Advised to use back up contraception x2 weeks.

## 2020-08-16 ENCOUNTER — Ambulatory Visit
Admission: RE | Admit: 2020-08-16 | Discharge: 2020-08-16 | Disposition: A | Discharge status: Home / Self Care | Payer: Medicaid Other | Source: Ambulatory Visit | Attending: Family Medicine | Admitting: Family Medicine

## 2020-08-16 ENCOUNTER — Ambulatory Visit (INDEPENDENT_AMBULATORY_CARE_PROVIDER_SITE_OTHER): Payer: Medicaid Other | Admitting: Family Medicine

## 2020-08-16 ENCOUNTER — Other Ambulatory Visit: Payer: Self-pay

## 2020-08-16 VITALS — BP 122/80 | HR 85 | Ht 64.0 in | Wt 224.2 lb

## 2020-08-16 DIAGNOSIS — F3170 Bipolar disorder, currently in remission, most recent episode unspecified: Secondary | ICD-10-CM

## 2020-08-16 DIAGNOSIS — M25571 Pain in right ankle and joints of right foot: Secondary | ICD-10-CM | POA: Diagnosis not present

## 2020-08-16 DIAGNOSIS — T7840XA Allergy, unspecified, initial encounter: Secondary | ICD-10-CM

## 2020-08-16 DIAGNOSIS — G8929 Other chronic pain: Secondary | ICD-10-CM | POA: Diagnosis not present

## 2020-08-16 DIAGNOSIS — M79671 Pain in right foot: Secondary | ICD-10-CM | POA: Diagnosis not present

## 2020-08-16 MED ORDER — EPINEPHRINE 0.3 MG/0.3ML IJ SOAJ: 0.3000 mg | INTRAMUSCULAR | 0 refills | Status: DC | PRN
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
## 2020-08-17 ENCOUNTER — Encounter: Payer: Self-pay | Admitting: Family Medicine

## 2020-08-17 ENCOUNTER — Other Ambulatory Visit: Payer: Self-pay | Admitting: Family Medicine

## 2020-08-17 DIAGNOSIS — M25571 Pain in right ankle and joints of right foot: Secondary | ICD-10-CM | POA: Insufficient documentation

## 2020-08-17 DIAGNOSIS — M79671 Pain in right foot: Secondary | ICD-10-CM | POA: Insufficient documentation

## 2020-08-17 NOTE — Assessment & Plan Note (Signed)
Chronic, dorsal midfoot.  Vague without any significant abnormality on exam.  Could consider laxity in arch support.  Recommendations as above in addition to appropriate orthotics with shoes at all times.
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
## 2020-08-17 NOTE — Addendum Note (Signed)
Addended by: Allayne Stack on: 08/17/2020 03:46 PM   Modules accepted: Orders

## 2020-08-17 NOTE — Assessment & Plan Note (Addendum)
Predominant depression phase, passive SI with no intent or plan.  Interested in reestablishing with a therapist and possibly psychiatrist, provided counseling resources in the area.  Aware of crisis hotline.  Provided supportive listening and encouraged follow-up.

## 2020-08-30 ENCOUNTER — Ambulatory Visit: Payer: Medicaid Other | Admitting: Family Medicine

## 2020-08-30 ENCOUNTER — Other Ambulatory Visit: Payer: Self-pay

## 2020-08-30 VITALS — BP 106/59 | Ht 64.0 in | Wt 222.0 lb

## 2020-08-30 DIAGNOSIS — M25571 Pain in right ankle and joints of right foot: Secondary | ICD-10-CM

## 2020-08-30 DIAGNOSIS — G8929 Other chronic pain: Secondary | ICD-10-CM | POA: Diagnosis not present
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
## 2020-08-30 NOTE — Patient Instructions (Signed)
It was great to see you!  Our plans for today:  -You were evaluated today for your right foot pain.  We performed an ultrasound and decided that it would be best to perform an MRI to look for an osteochondral defect.  We are scheduling the MRI and will let you know the results once it comes back. -In the meantime continue to use caution with your ankle, I recommend that you avoid activities which can cause increased pain until get the results of the MRI back.  Take care and seek immediate care sooner if you develop any concerns.   Sports medicine
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
## 2020-08-31 ENCOUNTER — Encounter: Payer: Self-pay | Admitting: Family Medicine

## 2020-09-13 ENCOUNTER — Ambulatory Visit
Admission: RE | Admit: 2020-09-13 | Discharge: 2020-09-13 | Disposition: A | Discharge status: Home / Self Care | Payer: Medicaid Other | Source: Ambulatory Visit | Attending: Family Medicine | Admitting: Family Medicine

## 2020-09-13 DIAGNOSIS — R600 Localized edema: Secondary | ICD-10-CM | POA: Diagnosis not present

## 2020-09-13 DIAGNOSIS — G8929 Other chronic pain: Secondary | ICD-10-CM

## 2020-09-13 DIAGNOSIS — M25571 Pain in right ankle and joints of right foot: Secondary | ICD-10-CM

## 2020-09-21 DIAGNOSIS — F419 Anxiety disorder, unspecified: Secondary | ICD-10-CM | POA: Diagnosis not present

## 2020-09-21 DIAGNOSIS — F39 Unspecified mood [affective] disorder: Secondary | ICD-10-CM | POA: Diagnosis not present

## 2020-09-21 DIAGNOSIS — F909 Attention-deficit hyperactivity disorder, unspecified type: Secondary | ICD-10-CM | POA: Diagnosis not present

## 2020-09-27 ENCOUNTER — Encounter: Payer: Self-pay | Admitting: Family Medicine

## 2020-09-27 ENCOUNTER — Other Ambulatory Visit: Payer: Self-pay

## 2020-09-27 ENCOUNTER — Ambulatory Visit (INDEPENDENT_AMBULATORY_CARE_PROVIDER_SITE_OTHER): Payer: Medicaid Other | Admitting: Family Medicine

## 2020-09-27 VITALS — BP 106/68 | Ht 63.0 in | Wt 220.0 lb

## 2020-09-27 DIAGNOSIS — M84374A Stress fracture, right foot, initial encounter for fracture: Secondary | ICD-10-CM | POA: Diagnosis not present

## 2020-09-27 DIAGNOSIS — G8929 Other chronic pain: Secondary | ICD-10-CM | POA: Diagnosis not present

## 2020-09-27 DIAGNOSIS — M25571 Pain in right ankle and joints of right foot: Secondary | ICD-10-CM

## 2020-09-27 NOTE — Patient Instructions (Signed)
Thank you for coming in to see [place] today! Please see below to review our plan for today's visit:   1.   Please plan to use the short leg walking boot whenever you are weightbearing for the next month. 2. Please avoid weightbearing as much as possible, including light duty at work. 3.   Lets plan to follow-up in 4 weeks.   Please call the clinic at [phone] if your symptoms worsen or you have any concerns. It was our pleasure to serve you.       Dr. Guy Sandifer [facility]
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
## 2020-10-26 DIAGNOSIS — F909 Attention-deficit hyperactivity disorder, unspecified type: Secondary | ICD-10-CM | POA: Diagnosis not present

## 2020-11-01 ENCOUNTER — Other Ambulatory Visit: Payer: Self-pay

## 2020-11-01 ENCOUNTER — Encounter: Payer: Self-pay | Admitting: Family Medicine

## 2020-11-01 ENCOUNTER — Ambulatory Visit (INDEPENDENT_AMBULATORY_CARE_PROVIDER_SITE_OTHER): Payer: Medicaid Other | Admitting: Family Medicine

## 2020-11-01 VITALS — BP 104/80 | Ht 64.0 in | Wt 236.0 lb

## 2020-11-01 DIAGNOSIS — G8929 Other chronic pain: Secondary | ICD-10-CM | POA: Diagnosis not present

## 2020-11-01 DIAGNOSIS — M8430XA Stress fracture, unspecified site, initial encounter for fracture: Secondary | ICD-10-CM | POA: Diagnosis not present

## 2020-11-01 DIAGNOSIS — M25571 Pain in right ankle and joints of right foot: Secondary | ICD-10-CM | POA: Diagnosis not present

## 2020-11-01 NOTE — Progress Notes (Addendum)
   PCP: Sandre Kitty, MD  Subjective:   HPI: Patient is a 20 y.o. female here for reevaluation of right ankle pain.  At last visit on 09/27/2020, MRI of the right ankle was reviewed and showed a right navicular stress reaction.  She was placed in a short leg walking boot with plan to wear this at all times and follow-up today for reevaluation.  Today, patient states that she has had resolution of her pain.  She continues to have laxity and feels like her ankle is weak but does not have any pain.  She has been diligent about wearing the boot except for some steps around the house at home.  No new trauma or injury, no new concerns.   Review of Systems:  Per HPI.   PMFSH, medications and smoking status reviewed.      Objective:  Physical Exam:  [facility] 08/30/2020 09/27/2020  Frequency of aerobic exercise (# of days/week) 0 0  Average time in minutes 0 0  Frequency of strengthening activities (# of days/week) 0 0     Gen: awake, alert, NAD, comfortable in exam room Pulm: breathing unlabored  Right ankle:  Inspection: No evidence of erythema, ecchymosis, swelling, edema.  Active ROM: Intact and non-painful to plantar/dorsiflexion, eversion,inversion. No pain with flexion/extension great toe  Strength: 5/5 strength without pain to resisted plantarflexion/dorsiflexion, inversion, eversion  Medial/lateral malleolus: Nontender  Base 5th Metatarsal: Nontender  Navicular:  Nontender  ATFL, CFL, PTFL: nontender Mortise/tib-talar joint: nontender Deltoid ligament: nontender Anterior drawer: Significant laxity bilaterally, slightly more severe on the right Talar/reverse talar tilt: Significant laxity bilaterally, slightly more severe on the right   Assessment & Plan:  1.  Right navicular stress reaction 2.  Chronic right ankle instability  Patient is now 4 weeks status post SLWB and has had resolution of her pain over the navicular.  Given this, we will  plan to cautiously transition to ASO ankle brace, and start formal PT for ankle strengthening.  She does have exam consistent with chronic ankle instability and in fact has significant instability on the left side as well so I suspect some of this to be chronic.  We discussed at length that if she does have any recurrence of the navicular pain, to go back into the boot and call here for a closer follow-up appointment.  If she is doing well and does not have recurrence of pain, will plan to follow-up in 1 to 2 months to reevaluate.  Guy Sandifer, MD [facility] 11/01/2020 2:38 PM  I was the preceptor for this visit and available for immediate consultation Marsa Aris, DO

## 2020-11-01 NOTE — Patient Instructions (Signed)
Thank you for coming in to see Anita [name] today! Please see below to review our plan for today's visit:   1.   Please transition from the boot into your ankle brace that was given to you today.  Please be sure to use the brace whenever you are out and about. 2.   Please start doing physical therapy at [facility] to help strengthen your ankle. 3.   If you start having recurrence of the pain in your ankle, please go back into the boot and call here for a sooner appointment.  Lets plan to follow-up in 1 to 2 months to reevaluate after the above.   Please call the clinic at [phone] if your symptoms worsen or you have any concerns. It was our pleasure to serve you.       Dr. Guy Sandifer [facility]

## 2021-03-01 ENCOUNTER — Other Ambulatory Visit: Payer: Self-pay

## 2021-03-01 ENCOUNTER — Emergency Department (HOSPITAL_BASED_OUTPATIENT_CLINIC_OR_DEPARTMENT_OTHER)
Admission: EM | Admit: 2021-03-01 | Discharge: 2021-03-01 | Disposition: A | Discharge status: Home / Self Care | Payer: Medicaid Other | Attending: Emergency Medicine | Admitting: Emergency Medicine

## 2021-03-01 ENCOUNTER — Encounter (HOSPITAL_BASED_OUTPATIENT_CLINIC_OR_DEPARTMENT_OTHER): Payer: Self-pay | Admitting: *Deleted

## 2021-03-01 DIAGNOSIS — J45909 Unspecified asthma, uncomplicated: Secondary | ICD-10-CM | POA: Insufficient documentation

## 2021-03-01 DIAGNOSIS — M6740 Ganglion, unspecified site: Secondary | ICD-10-CM

## 2021-03-01 DIAGNOSIS — M67432 Ganglion, left wrist: Secondary | ICD-10-CM | POA: Diagnosis not present

## 2021-03-01 DIAGNOSIS — M25532 Pain in left wrist: Secondary | ICD-10-CM | POA: Diagnosis present

## 2021-03-01 DIAGNOSIS — F17210 Nicotine dependence, cigarettes, uncomplicated: Secondary | ICD-10-CM | POA: Diagnosis not present

## 2021-03-01 MED ORDER — TRIAMCINOLONE ACETONIDE 40 MG/ML IJ SUSP
40.0000 mg | Freq: Once | INTRAMUSCULAR | Status: AC
  Administered 2021-03-01: 40 mg via INTRA_ARTICULAR
  Filled 2021-03-01: qty 5

## 2021-03-01 MED ORDER — LIDOCAINE HCL (PF) 1 % IJ SOLN
5.0000 mL | Freq: Once | INTRAMUSCULAR | Status: AC
  Administered 2021-03-01: 5 mL
  Filled 2021-03-01: qty 5

## 2021-03-01 NOTE — ED Provider Notes (Signed)
[facility] EMERGENCY DEPT Provider Note   CSN: [id number] Arrival date & time: 03/01/21  0940     History Chief Complaint  Patient presents with  . Cyst    Anita [name] is a 20 y.o. female.  Anita [name] has been dealing with a cyst on her left wrist for years.  Recently it has become more painful.  She is right-handed and denies any repetitive use of her wrist.  The history is provided by the patient.  Wrist Pain This is a chronic problem. Episode onset: a couple of years, worse x 1 month. The problem occurs constantly. The problem has been gradually worsening. Pertinent negatives include no chest pain, no abdominal pain, no headaches and no shortness of breath. The symptoms are aggravated by bending. The symptoms are relieved by position. Treatments tried: OTC meds, wrist splint. The treatment provided no relief.       Past Medical History:  Diagnosis Date  . Anxiety   . Asthma    age 69  . Bipolar disorder (HCC)   . Eczema   . Migraines     Patient Active Problem List   Diagnosis Date Noted  . Ankle pain, right 08/17/2020  . Right foot pain 08/17/2020  . Nexplanon insertion 05/11/2019  . Adverse food reaction 04/29/2019  . Seasonal and perennial allergic rhinitis 04/29/2019  . Chronic urticaria 04/29/2019  . Allergic reaction 04/14/2019  . Chronic left-sided low back pain without sciatica 04/14/2019  . Ganglion, left wrist 07/02/2018  . Obesity 10/16/2016  . Bipolar disorder [facility])     Past Surgical History:  Procedure Laterality Date  . KNEE SURGERY  01/23/2015   L knee torn meniscus and ACL  . TONSILLECTOMY AND ADENOIDECTOMY  2012     OB History   No obstetric history on file.     Family History  Problem Relation Age of Onset  . Alcohol abuse Mother   . Depression Mother   . Thyroid disease Mother   . Heart attack Mother 57       died of heart attack  . Bipolar disorder Mother   . Asthma Mother   . Vision loss Sister   . Asthma  Father        as a child  . Hypertension Maternal Grandfather     Social History   Tobacco Use  . Smoking status: Light Tobacco Smoker    Types: Cigarettes  . Smokeless tobacco: Never Used  Vaping Use  . Vaping Use: Former  Substance Use Topics  . Alcohol use: Yes  . Drug use: No    Comment: past marijuana use, former    Home Medications Prior to Admission medications   Medication Sig Start Date End Date Taking? Authorizing Provider  EPINEPHrine 0.3 mg/0.3 mL IJ SOAJ injection Inject 0.3 mg into the muscle as needed for anaphylaxis. 08/16/20   Allayne Stack, DO    Allergies    Patient has no known allergies.  Review of Systems   Review of Systems  Constitutional: Negative for chills and fever.  HENT: Negative for ear pain and sore throat.   Eyes: Negative for pain and visual disturbance.  Respiratory: Negative for cough and shortness of breath.   Cardiovascular: Negative for chest pain and palpitations.  Gastrointestinal: Negative for abdominal pain and vomiting.  Genitourinary: Negative for dysuria and hematuria.  Musculoskeletal: Negative for arthralgias and back pain.  Skin: Negative for color change and rash.  Neurological: Negative for seizures, syncope and headaches.  All other systems reviewed and are negative.   Physical Exam Updated Vital Signs BP 115/70 (BP Location: Right Arm)   Pulse 83   Temp 98.5 F (36.9 C) (Oral)   Ht 5\' 4"  (1.626 m)   Wt 117.9 kg   SpO2 99%   BMI 44.63 kg/m   Physical Exam Vitals and nursing note reviewed.  HENT:     Head: Normocephalic and atraumatic.  Eyes:     General: No scleral icterus. Pulmonary:     Effort: Pulmonary effort is normal. No respiratory distress.  Musculoskeletal:     Cervical back: Normal range of motion.     Comments: There is a grape sized cystic mass on the volar aspect of the left wrist immediately below the base of the first metacarpal.  No surrounding erythema of the skin.  Wrist range of  motion is full.  Skin:    General: Skin is warm and dry.     Capillary Refill: Capillary refill takes less than 2 seconds.  Neurological:     Mental Status: She is alert.  Psychiatric:        Mood and Affect: Mood normal.     ED Results / Procedures / Treatments   Labs (all labs ordered are listed, but only abnormal results are displayed) Labs Reviewed - No data to display  EKG None  Radiology No results found.  Procedures . Incision and Drainage  Date/Time: 03/01/2021 10:26 AM Performed by: 05/01/2021, MD Authorized by: Koleen Distance, MD   Consent:    Consent obtained:  Verbal   Consent given by:  Patient   Risks, benefits, and alternatives were discussed: yes     Risks discussed:  Bleeding, incomplete drainage, pain, infection and damage to other organs (discoloration of skin from steroid)   Alternatives discussed:  No treatment, delayed treatment, alternative treatment, observation and referral Universal protocol:    Procedure explained and questions answered to patient or proxy's satisfaction: yes     Immediately prior to procedure, a time out was called: yes     Patient identity confirmed:  Verbally with patient Location:    Type:  Cyst   Size:  2 cm   Location:  Upper extremity   Upper extremity location:  Wrist   Wrist location:  L wrist Pre-procedure details:    Procedure prep: alcohol. Sedation:    Sedation type:  None Anesthesia:    Anesthesia method:  Local infiltration   Local anesthetic:  Lidocaine 1% w/o epi Procedure type:    Complexity:  Simple Procedure details:    Ultrasound guidance: no     Needle aspiration: yes     Needle size:  18 G   Drainage:  Serous   Drainage amount:  Scant Post-procedure details:    Procedure completion:  Tolerated well, no immediate complications Comments:     40 mg of kenalog was injected into the cyst. A bandage was applied.     Medications Ordered in ED Medications  lidocaine (PF) (XYLOCAINE) 1 %  injection 5 mL (has no administration in time range)  triamcinolone acetonide (KENALOG-40) injection 40 mg (has no administration in time range)    ED Course  I have reviewed the triage vital signs and the nursing notes.  Pertinent labs & imaging results that were available during my care of the patient were reviewed by me and considered in my medical decision making (see chart for details).    MDM Rules/Calculators/A&P  Madie Reno Ysaguirre scented with acute on chronic exacerbation of her left wrist ganglion cyst.  We spoke about alternatives, and she has tried many conservative options.  I did discuss the option of aspiration and injection.  I told her this was somewhat successful and at times it could not be definitive treatment.  She is still a candidate for surgical management if this aspiration and injection fail to improve her symptoms.  She did not want to go ahead with the procedure, and I was able to aspirate and inject the cyst.  She was given instructions on symptomatic management and contact information for a hand specialist should her symptoms persist. Final Clinical Impression(s) / ED Diagnoses Final diagnoses:  Ganglion cyst    Rx / DC Orders ED Discharge Orders    None       Koleen Distance, MD 03/01/21 1029

## 2021-03-01 NOTE — ED Triage Notes (Signed)
Cyst on left wrist, present x 3 years , over last month pain has become worse

## 2021-04-11 ENCOUNTER — Other Ambulatory Visit (HOSPITAL_BASED_OUTPATIENT_CLINIC_OR_DEPARTMENT_OTHER): Payer: Self-pay

## 2021-04-11 DIAGNOSIS — Z8249 Family history of ischemic heart disease and other diseases of the circulatory system: Secondary | ICD-10-CM | POA: Insufficient documentation

## 2021-04-11 MED ORDER — BUSPIRONE HCL 10 MG PO TABS
ORAL_TABLET | ORAL | 1 refills | Status: DC
  Filled 2021-04-11: qty 60, 30d supply, fill #0

## 2021-04-11 MED ORDER — FLUOXETINE HCL 10 MG PO CAPS
ORAL_CAPSULE | ORAL | 2 refills | Status: DC
  Filled 2021-04-11: qty 46, 30d supply, fill #0
  Filled 2021-05-11: qty 60, 30d supply, fill #1

## 2021-05-11 ENCOUNTER — Other Ambulatory Visit (HOSPITAL_BASED_OUTPATIENT_CLINIC_OR_DEPARTMENT_OTHER): Payer: Self-pay

## 2021-05-11 DIAGNOSIS — F331 Major depressive disorder, recurrent, moderate: Secondary | ICD-10-CM | POA: Insufficient documentation

## 2021-05-11 DIAGNOSIS — L309 Dermatitis, unspecified: Secondary | ICD-10-CM | POA: Insufficient documentation

## 2021-05-11 DIAGNOSIS — F900 Attention-deficit hyperactivity disorder, predominantly inattentive type: Secondary | ICD-10-CM | POA: Insufficient documentation

## 2021-05-11 MED ORDER — TRIAMCINOLONE ACETONIDE 0.1 % EX CREA
TOPICAL_CREAM | Freq: Two times a day (BID) | CUTANEOUS | 1 refills | Status: DC
  Filled 2021-05-11: qty 80, 14d supply, fill #0
  Filled 2021-12-31: qty 80, 14d supply, fill #1

## 2021-05-11 MED ORDER — AMPHETAMINE-DEXTROAMPHETAMINE 10 MG PO TABS
ORAL_TABLET | ORAL | 0 refills | Status: DC
  Filled 2021-05-11: qty 60, 30d supply, fill #0

## 2021-05-11 MED ORDER — BUSPIRONE HCL 15 MG PO TABS
ORAL_TABLET | ORAL | 11 refills | Status: DC
  Filled 2021-05-11: qty 90, 30d supply, fill #0
  Filled 2021-11-23: qty 90, 30d supply, fill #1

## 2021-05-14 ENCOUNTER — Other Ambulatory Visit (HOSPITAL_BASED_OUTPATIENT_CLINIC_OR_DEPARTMENT_OTHER): Payer: Self-pay

## 2021-05-14 MED ORDER — FLUOXETINE HCL 40 MG PO CAPS
ORAL_CAPSULE | ORAL | 1 refills | Status: DC
  Filled 2021-05-14: qty 90, 90d supply, fill #0
  Filled 2021-09-20: qty 90, 90d supply, fill #1

## 2021-05-15 ENCOUNTER — Other Ambulatory Visit: Payer: Self-pay

## 2021-05-15 ENCOUNTER — Other Ambulatory Visit (HOSPITAL_BASED_OUTPATIENT_CLINIC_OR_DEPARTMENT_OTHER): Payer: Self-pay

## 2021-05-15 ENCOUNTER — Emergency Department (HOSPITAL_BASED_OUTPATIENT_CLINIC_OR_DEPARTMENT_OTHER)
Admission: EM | Admit: 2021-05-15 | Discharge: 2021-05-15 | Disposition: A | Discharge status: Home / Self Care | Payer: Medicaid Other | Attending: Emergency Medicine | Admitting: Emergency Medicine

## 2021-05-15 ENCOUNTER — Emergency Department (HOSPITAL_BASED_OUTPATIENT_CLINIC_OR_DEPARTMENT_OTHER): Payer: Medicaid Other

## 2021-05-15 ENCOUNTER — Encounter (HOSPITAL_BASED_OUTPATIENT_CLINIC_OR_DEPARTMENT_OTHER): Payer: Self-pay

## 2021-05-15 DIAGNOSIS — Y92002 Bathroom of unspecified non-institutional (private) residence single-family (private) house as the place of occurrence of the external cause: Secondary | ICD-10-CM | POA: Diagnosis not present

## 2021-05-15 DIAGNOSIS — J45909 Unspecified asthma, uncomplicated: Secondary | ICD-10-CM | POA: Insufficient documentation

## 2021-05-15 DIAGNOSIS — Y93E3 Activity, vacuuming: Secondary | ICD-10-CM | POA: Insufficient documentation

## 2021-05-15 DIAGNOSIS — W228XXA Striking against or struck by other objects, initial encounter: Secondary | ICD-10-CM | POA: Insufficient documentation

## 2021-05-15 DIAGNOSIS — S0592XA Unspecified injury of left eye and orbit, initial encounter: Secondary | ICD-10-CM | POA: Insufficient documentation

## 2021-05-15 DIAGNOSIS — F17210 Nicotine dependence, cigarettes, uncomplicated: Secondary | ICD-10-CM | POA: Diagnosis not present

## 2021-05-15 DIAGNOSIS — S0590XA Unspecified injury of unspecified eye and orbit, initial encounter: Secondary | ICD-10-CM

## 2021-05-15 NOTE — ED Provider Notes (Signed)
[facility] EMERGENCY [facility] Emergency Department Provider Note MRN:  [id number]  Arrival date & time: 05/15/21     Chief Complaint   Eye Pain   History of Present Illness   Anita [name] is a 20 y.o. year-old female with a history of bipolar disorder presenting to the ED with chief complaint of eye pain.  Patient endorsing direct trauma to the left eye.  Was vacuuming and cleaning out her closet, bent down and the handle of the vacuum struck her left eye.  Severe pain.  I was closed, denies any pain to the surface of the eye or scratched eye or foreign body sensation to the eye.  It feels like more of a pressure behind the eye.  Pain has improved slightly but still present.  Denies loss of consciousness, no vision change, no nausea vomiting, no neck pain, no other complaints.  Review of Systems  A complete 10 system review of systems was obtained and all systems are negative except as noted in the HPI and PMH.   Patient's Health History    Past Medical History:  Diagnosis Date   Anxiety    Asthma    age 37   Bipolar disorder (HCC)    Eczema    Migraines     Past Surgical History:  Procedure Laterality Date   KNEE SURGERY  01/23/2015   L knee torn meniscus and ACL   TONSILLECTOMY AND ADENOIDECTOMY  2012    Family History  Problem Relation Age of Onset   Alcohol abuse Mother    Depression Mother    Thyroid disease Mother    Heart attack Mother 36       died of heart attack   Bipolar disorder Mother    Asthma Mother    Vision loss Sister    Asthma Father        as a child   Hypertension Maternal Grandfather     Social History   Socioeconomic History   Marital status: Single    Spouse name: Not on file   Number of children: Not on file   Years of education: Not on file   Highest education level: Not on file  Occupational History   Occupation: student  Tobacco Use   Smoking status: Light Smoker    Types: Cigarettes   Smokeless tobacco: Never   Vaping Use   Vaping Use: Former  Substance and Sexual Activity   Alcohol use: Yes   Drug use: No    Comment: past marijuana use, former   Sexual activity: Not Currently    Comment: depo before  Other Topics Concern   Not on file  Social History Narrative   Lives with aunt (who has custody), cat and dog   Social Determinants of Health   Financial Resource Strain: Not on file  Food Insecurity: Not on file  Transportation Needs: Not on file  Physical Activity: Not on file  Stress: Not on file  Social Connections: Not on file  Intimate Partner Violence: Not on file     Physical Exam   Vitals:   05/15/21 0253  BP: 123/76  Pulse: 74  Resp: 20  Temp: 98.3 F (36.8 C)  SpO2: 100%    CONSTITUTIONAL: Well-appearing, NAD NEURO:  Alert and oriented x 3, no focal deficits EYES:  eyes equal and reactive, normal extraocular movements, visual acuity 20/20 bilaterally, minimal conjunctival injection to the medial aspect of the left eye ENT/NECK:  no LAD, no JVD CARDIO: Regular rate,  well-perfused, normal S1 and S2 PULM:  CTAB no wheezing or rhonchi GI/GU:  normal bowel sounds, non-distended, non-tender MSK/SPINE:  No gross deformities, no edema SKIN:  no rash, atraumatic PSYCH:  Appropriate speech and behavior  *Additional and/or pertinent findings included in MDM below  Diagnostic and Interventional Summary    EKG Interpretation  Date/Time:    Ventricular Rate:    PR Interval:    QRS Duration:   QT Interval:    QTC Calculation:   R Axis:     Text Interpretation:         Labs Reviewed - No data to display  CT Maxillofacial Wo Contrast  Final Result      Medications - No data to display   Procedures  /  Critical Care Procedures  ED Course and Medical Decision Making  I have reviewed the triage vital signs, the nursing notes, and pertinent available records from the EMR.  Listed above are laboratory and imaging tests that I personally ordered, reviewed, and  interpreted and then considered in my medical decision making (see below for details).  Overall the outward appearance of the orbit is very reassuring, minimal erythema, normal extraocular movements, normal visual acuity.  Highly doubt globe rupture, highly doubt corneal abrasion.  Given the direct trauma to the eye there is concern for possible orbital wall fracture, retrobulbar hematoma given the continued pain.  Awaiting CT imaging.     CT normal, appropriate for discharge.  Elmer Sow. Pilar Plate, MD [facility] [email or web address]  Final Clinical Impressions(s) / ED Diagnoses     ICD-10-CM   1. Eye trauma  [email or web address]       ED Discharge Orders     None        Discharge Instructions Discussed with and Provided to Patient:     Discharge Instructions      You were evaluated in the Emergency Department and after careful evaluation, we did not find any emergent condition requiring admission or further testing in the hospital.  Your exam/testing today is overall reassuring.  CT did not show any broken bones or emergencies.  Recommend follow-up with eye specialist if her symptoms persist.  Suspect you are dealing with some bruising and will be better within the next few days.  Please return to the Emergency Department if you experience any worsening of your condition.   Thank you for allowing [place] to be a part of your care.        Sabas Sous, MD 05/15/21 [phone]

## 2021-05-15 NOTE — ED Triage Notes (Signed)
Pt states  she hit her left eye with the vacuum handle while cleaning the bathroom today   Denies changes in vision

## 2021-05-15 NOTE — Discharge Instructions (Addendum)
You were evaluated in the Emergency Department and after careful evaluation, we did not find any emergent condition requiring admission or further testing in the hospital.  Your exam/testing today is overall reassuring.  CT did not show any broken bones or emergencies.  Recommend follow-up with eye specialist if her symptoms persist.  Suspect you are dealing with some bruising and will be better within the next few days.  Please return to the Emergency Department if you experience any worsening of your condition.   Thank you for allowing [place] to be a part of your care.

## 2021-05-31 ENCOUNTER — Other Ambulatory Visit (HOSPITAL_BASED_OUTPATIENT_CLINIC_OR_DEPARTMENT_OTHER): Payer: Self-pay

## 2021-05-31 DIAGNOSIS — M25552 Pain in left hip: Secondary | ICD-10-CM | POA: Insufficient documentation

## 2021-05-31 MED ORDER — TRAMADOL HCL 50 MG PO TABS
ORAL_TABLET | ORAL | 0 refills | Status: DC
  Filled 2021-05-31: qty 20, 5d supply, fill #0

## 2021-06-15 ENCOUNTER — Other Ambulatory Visit (HOSPITAL_BASED_OUTPATIENT_CLINIC_OR_DEPARTMENT_OTHER): Payer: Self-pay

## 2021-06-15 MED ORDER — AMPHETAMINE-DEXTROAMPHETAMINE 20 MG PO TABS
ORAL_TABLET | ORAL | 0 refills | Status: DC
  Filled 2021-06-15: qty 60, 30d supply, fill #0

## 2021-06-15 MED ORDER — MELOXICAM 15 MG PO TABS
ORAL_TABLET | ORAL | 2 refills | Status: DC
  Filled 2021-06-15: qty 30, 30d supply, fill #0

## 2021-07-10 ENCOUNTER — Other Ambulatory Visit (HOSPITAL_BASED_OUTPATIENT_CLINIC_OR_DEPARTMENT_OTHER): Payer: Self-pay

## 2021-07-24 ENCOUNTER — Encounter (HOSPITAL_BASED_OUTPATIENT_CLINIC_OR_DEPARTMENT_OTHER): Payer: Self-pay

## 2021-07-24 ENCOUNTER — Emergency Department (HOSPITAL_BASED_OUTPATIENT_CLINIC_OR_DEPARTMENT_OTHER): Payer: Medicaid Other

## 2021-07-24 ENCOUNTER — Other Ambulatory Visit: Payer: Self-pay

## 2021-07-24 DIAGNOSIS — J3489 Other specified disorders of nose and nasal sinuses: Secondary | ICD-10-CM | POA: Diagnosis not present

## 2021-07-24 DIAGNOSIS — R519 Headache, unspecified: Secondary | ICD-10-CM | POA: Diagnosis not present

## 2021-07-24 DIAGNOSIS — J45909 Unspecified asthma, uncomplicated: Secondary | ICD-10-CM | POA: Insufficient documentation

## 2021-07-24 DIAGNOSIS — F17210 Nicotine dependence, cigarettes, uncomplicated: Secondary | ICD-10-CM | POA: Diagnosis not present

## 2021-07-24 DIAGNOSIS — Z20822 Contact with and (suspected) exposure to covid-19: Secondary | ICD-10-CM | POA: Insufficient documentation

## 2021-07-24 DIAGNOSIS — H9201 Otalgia, right ear: Secondary | ICD-10-CM | POA: Diagnosis not present

## 2021-07-24 NOTE — ED Triage Notes (Signed)
Patient here POV from Home with Sinus Pain.  Patient states this past Sunday she was eating Chicken and due to Migraine and Nausea she was having at that time she vomited and believes there is a piece of Chicken in her Sinuses. Patient feels Pain worse on Right Side and is Nerve-Like.  Pain has worsened since. NAD Noted during Triage. Ambulatory. GCS 15.

## 2021-07-25 ENCOUNTER — Emergency Department (HOSPITAL_BASED_OUTPATIENT_CLINIC_OR_DEPARTMENT_OTHER): Payer: Medicaid Other

## 2021-07-25 ENCOUNTER — Emergency Department (HOSPITAL_BASED_OUTPATIENT_CLINIC_OR_DEPARTMENT_OTHER)
Admission: EM | Admit: 2021-07-25 | Discharge: 2021-07-25 | Disposition: A | Discharge status: Home / Self Care | Payer: Medicaid Other | Attending: Emergency Medicine | Admitting: Emergency Medicine

## 2021-07-25 ENCOUNTER — Encounter (HOSPITAL_BASED_OUTPATIENT_CLINIC_OR_DEPARTMENT_OTHER): Payer: Self-pay | Admitting: Emergency Medicine

## 2021-07-25 DIAGNOSIS — Z20822 Contact with and (suspected) exposure to covid-19: Secondary | ICD-10-CM

## 2021-07-25 DIAGNOSIS — R519 Headache, unspecified: Secondary | ICD-10-CM

## 2021-07-25 LAB — RESP PANEL BY RT-PCR (FLU A&B, COVID) ARPGX2
Influenza A by PCR: NEGATIVE
Influenza B by PCR: NEGATIVE
SARS Coronavirus 2 by RT PCR: NEGATIVE

## 2021-07-25 MED ORDER — IBUPROFEN 800 MG PO TABS
800.0000 mg | ORAL_TABLET | Freq: Once | ORAL | Status: AC
  Administered 2021-07-25: 800 mg via ORAL
  Filled 2021-07-25: qty 1

## 2021-07-25 NOTE — ED Provider Notes (Signed)
[facility] EMERGENCY DEPT Provider Note   CSN: [id number] Arrival date & time: 07/24/21  2306     History Chief Complaint  Patient presents with   Foreign Body in Nose    Anita [name] is a 20 y.o. female.  The history is provided by the patient.  Foreign Body in Nose This is a new problem. The current episode started more than 2 days ago. The problem occurs constantly. The problem has not changed since onset.Pertinent negatives include no chest pain, no abdominal pain, no headaches and no shortness of breath. Nothing aggravates the symptoms. Nothing relieves the symptoms. She has tried nothing for the symptoms. The treatment provided no relief.  Patient with Bipolar who thinks there is chicke nin her sinuses secondary to vomiting on Sunday. Has a pain from right ear to nose.  No nasal discharge.  No f/c/r.      Past Medical History:  Diagnosis Date   Anxiety    Asthma    age 89   Bipolar disorder (HCC)    Eczema    Migraines     Patient Active Problem List   Diagnosis Date Noted   Ankle pain, right 08/17/2020   Right foot pain 08/17/2020   Nexplanon insertion 05/11/2019   Adverse food reaction 04/29/2019   Seasonal and perennial allergic rhinitis 04/29/2019   Chronic urticaria 04/29/2019   Allergic reaction 04/14/2019   Chronic left-sided low back pain without sciatica 04/14/2019   Ganglion, left wrist 07/02/2018   Obesity 10/16/2016   Bipolar disorder (HCC)     Past Surgical History:  Procedure Laterality Date   KNEE SURGERY  01/23/2015   L knee torn meniscus and ACL   TONSILLECTOMY AND ADENOIDECTOMY  2012     OB History   No obstetric history on file.     Family History  Problem Relation Age of Onset   Alcohol abuse Mother    Depression Mother    Thyroid disease Mother    Heart attack Mother 78       died of heart attack   Bipolar disorder Mother    Asthma Mother    Vision loss Sister    Asthma Father        as a child    Hypertension Maternal Grandfather     Social History   Tobacco Use   Smoking status: Light Smoker    Types: Cigarettes   Smokeless tobacco: Never  Vaping Use   Vaping Use: Former  Substance Use Topics   Alcohol use: Yes   Drug use: No    Comment: past marijuana use, former    Home Medications Prior to Admission medications   Medication Sig Start Date End Date Taking? Authorizing Provider  amphetamine-dextroamphetamine (ADDERALL) 10 MG tablet Take one tablet (10 mg dose) by mouth 2 (two) times daily for 30 days. 05/11/21     amphetamine-dextroamphetamine (ADDERALL) 20 MG tablet Take 1 tablet (20 mg dose) by mouth 2 (two) times daily. 06/15/21     busPIRone (BUSPAR) 10 MG tablet Take one tablet (10 mg dose) by mouth 2 (two) times a day as needed. 04/11/21     busPIRone (BUSPAR) 15 MG tablet Take one tablet (15 mg dose) by mouth 3 (three) times a day. 05/11/21     EPINEPHrine 0.3 mg/0.3 mL IJ SOAJ injection Inject 0.3 mg into the muscle as needed for anaphylaxis. 08/16/20   Allayne Stack, DO  FLUoxetine (PROZAC) 10 MG capsule Take 1 capsule (10 mg dose)  by mouth daily. After two weeks, take 2 capsules by mouth daily. 04/11/21     FLUoxetine (PROZAC) 40 MG capsule Take one capsule (40 mg dose) by mouth daily. 05/11/21     meloxicam (MOBIC) 15 MG tablet Take 1 tablet (15 mg dose) by mouth daily. 06/15/21     traMADol (ULTRAM) 50 MG tablet Take one tablet (50 mg dose) by mouth every 6 (six) hours as needed for Pain for up to 5 days. 05/31/21     triamcinolone cream (KENALOG) 0.1 % Apply topically 2 (two) times daily. 05/11/21       Allergies    Patient has no known allergies.  Review of Systems   Review of Systems  Constitutional:  Negative for fever.  HENT:  Negative for congestion, drooling, ear discharge, facial swelling and rhinorrhea.   Eyes:  Negative for pain and redness.  Respiratory:  Negative for shortness of breath.   Cardiovascular:  Negative for chest pain.   Gastrointestinal:  Negative for abdominal pain.  Genitourinary:  Negative for difficulty urinating.  Musculoskeletal:  Negative for neck pain.  Skin:  Negative for rash.  Neurological:  Negative for headaches.  Psychiatric/Behavioral:  Negative for agitation.   All other systems reviewed and are negative.  Physical Exam Updated Vital Signs BP 125/61 (BP Location: Right Arm)   Pulse (!) 110   Temp 98.9 F (37.2 C) (Oral)   Resp 16   Ht 5\' 4"  (1.626 m)   Wt 119.3 kg   SpO2 100%   BMI 45.15 kg/m   Physical Exam Vitals and nursing note reviewed.  Constitutional:      General: She is not in acute distress.    Appearance: Normal appearance.  HENT:     Head: Normocephalic and atraumatic.     Nose: Nose normal.  Eyes:     Conjunctiva/sclera: Conjunctivae normal.     Pupils: Pupils are equal, round, and reactive to light.  Cardiovascular:     Rate and Rhythm: Normal rate and regular rhythm.     Pulses: Normal pulses.     Heart sounds: Normal heart sounds.  Pulmonary:     Effort: Pulmonary effort is normal.     Breath sounds: Normal breath sounds.  Abdominal:     General: Abdomen is flat. Bowel sounds are normal.     Palpations: Abdomen is soft.     Tenderness: There is no abdominal tenderness. There is no guarding.  Musculoskeletal:        General: Normal range of motion.     Cervical back: Normal range of motion and neck supple.  Skin:    General: Skin is warm and dry.     Capillary Refill: Capillary refill takes less than 2 seconds.  Neurological:     General: No focal deficit present.     Mental Status: She is alert and oriented to person, place, and time.     Deep Tendon Reflexes: Reflexes normal.  Psychiatric:        Mood and Affect: Mood normal.        Behavior: Behavior normal.    ED Results / Procedures / Treatments   Labs (all labs ordered are listed, but only abnormal results are displayed) Labs Reviewed  RESP PANEL BY RT-PCR (FLU A&B, COVID) [id number]     EKG None  Radiology CT Head Wo Contrast  Result Date: 07/24/2021 CLINICAL DATA:  Recent vomiting with possible foreign body in the nasal passages, initial encounter EXAM: CT HEAD WITHOUT CONTRAST  TECHNIQUE: Contiguous axial images were obtained from the base of the skull through the vertex without intravenous contrast. COMPARISON:  None. FINDINGS: Brain: No evidence of acute infarction, hemorrhage, hydrocephalus, extra-axial collection or mass lesion/mass effect. Vascular: No hyperdense vessel or unexpected calcification. Skull: Normal. Negative for fracture or focal lesion. Sinuses/Orbits: Visualized paranasal sinuses are within normal limits. The orbits are unremarkable. The nasal passages and maxillary antra are incompletely evaluated on this exam. Other: None IMPRESSION: No acute abnormality noted. Given the patient's clinical history a maxillofacial CT may be better suited for evaluation. Electronically Signed   By: Alcide Clever M.D.   On: 07/24/2021 23:42   CT Maxillofacial Wo Contrast  Result Date: 07/25/2021 CLINICAL DATA:  Recent episode of vomiting with possible foreign body in the nasal passages, initial encounter EXAM: CT MAXILLOFACIAL WITHOUT CONTRAST TECHNIQUE: Multidetector CT imaging of the maxillofacial structures was performed. Multiplanar CT image reconstructions were also generated. COMPARISON:  None. FINDINGS: Osseous: No acute fracture is noted. No destructive bony abnormalities are seen. Orbits: Orbits and their contents are within normal limits. Sinuses: Paranasal sinuses are well aerated. No focal filling defect is identified. No filling defect within the nasal passages is seen to correspond with the given clinical history. Soft tissues: Surrounding soft tissue structures appear within normal limits. Limited intracranial: Within normal limits. IMPRESSION: No acute abnormality of the maxillofacial bones. No definitive filling defect to correspond with the given clinical  history is noted. Electronically Signed   By: Alcide Clever M.D.   On: 07/25/2021 01:06    Procedures Procedures   Medications Ordered in ED Medications  ibuprofen (ADVIL) tablet 800 mg (800 mg Oral Given 07/25/21 0112)    ED Course  I have reviewed the triage vital signs and the nursing notes.  Pertinent labs & imaging results that were available during my care of the patient were reviewed by me and considered in my medical decision making (see chart for details).   No FB on CT.  Likely facial pain related to nerve.  No sinus infection on CT.  Well appearing.  COVID sent.    Taelor Waymire Dutko was evaluated in Emergency Department on 07/25/2021 for the symptoms described in the history of present illness. She was evaluated in the context of the global COVID-19 pandemic, which necessitated consideration that the patient might be at risk for infection with the SARS-CoV-2 virus that causes COVID-19. Institutional protocols and algorithms that pertain to the evaluation of patients at risk for COVID-19 are in a state of rapid change based on information released by regulatory bodies including the CDC and federal and state organizations. These policies and algorithms were followed during the patient's care in the ED.  Final Clinical Impression(s) / ED Diagnoses Final diagnoses:  Facial pain  Person under investigation for COVID-19   Return for intractable cough, coughing up blood, fevers > 100.4 unrelieved by medication, shortness of breath, intractable vomiting, chest pain, shortness of breath, weakness, numbness, changes in speech, facial asymmetry, abdominal pain, passing out, Inability to tolerate liquids or food, cough, altered mental status or any concerns. No signs of systemic illness or infection. The patient is nontoxic-appearing on exam and vital signs are within normal limits.  I have reviewed the triage vital signs and the nursing notes. Pertinent labs & imaging results that were  available during my care of the patient were reviewed by me and considered in my medical decision making (see chart for details). After history, exam, and medical workup I feel the  patient has been appropriately medically screened and is safe for discharge home. Pertinent diagnoses were discussed with the patient. Patient was given return precautions.    Rx / DC Orders ED Discharge Orders     None        Tawsha Terrero, MD 07/25/21 0116

## 2021-07-27 ENCOUNTER — Other Ambulatory Visit (HOSPITAL_BASED_OUTPATIENT_CLINIC_OR_DEPARTMENT_OTHER): Payer: Self-pay

## 2021-07-27 MED ORDER — AMPHETAMINE-DEXTROAMPHETAMINE 30 MG PO TABS
ORAL_TABLET | ORAL | 0 refills | Status: DC
  Filled 2021-07-27: qty 30, 15d supply, fill #0

## 2021-07-30 ENCOUNTER — Other Ambulatory Visit (HOSPITAL_BASED_OUTPATIENT_CLINIC_OR_DEPARTMENT_OTHER): Payer: Self-pay

## 2021-07-31 ENCOUNTER — Other Ambulatory Visit (HOSPITAL_BASED_OUTPATIENT_CLINIC_OR_DEPARTMENT_OTHER): Payer: Self-pay

## 2021-07-31 MED ORDER — GABAPENTIN 300 MG PO CAPS
ORAL_CAPSULE | ORAL | 0 refills | Status: DC
  Filled 2021-07-31: qty 90, 30d supply, fill #0

## 2021-08-01 ENCOUNTER — Encounter (HOSPITAL_BASED_OUTPATIENT_CLINIC_OR_DEPARTMENT_OTHER): Payer: Self-pay

## 2021-08-01 ENCOUNTER — Other Ambulatory Visit (HOSPITAL_BASED_OUTPATIENT_CLINIC_OR_DEPARTMENT_OTHER): Payer: Self-pay

## 2021-08-01 MED ORDER — OLANZAPINE 2.5 MG PO TABS
ORAL_TABLET | ORAL | 5 refills | Status: DC
  Filled 2021-08-01: qty 30, 30d supply, fill #0

## 2021-08-02 ENCOUNTER — Other Ambulatory Visit (HOSPITAL_BASED_OUTPATIENT_CLINIC_OR_DEPARTMENT_OTHER): Payer: Self-pay

## 2021-09-20 ENCOUNTER — Other Ambulatory Visit (HOSPITAL_BASED_OUTPATIENT_CLINIC_OR_DEPARTMENT_OTHER): Payer: Self-pay

## 2021-09-20 MED ORDER — AMPHETAMINE-DEXTROAMPHETAMINE 30 MG PO TABS
ORAL_TABLET | ORAL | 0 refills | Status: DC
  Filled 2021-09-20: qty 30, 15d supply, fill #0

## 2021-09-20 MED ORDER — GABAPENTIN 300 MG PO CAPS
ORAL_CAPSULE | ORAL | 0 refills | Status: DC
Start: 2021-09-20 — End: 2022-01-03

## 2021-09-24 ENCOUNTER — Other Ambulatory Visit (HOSPITAL_BASED_OUTPATIENT_CLINIC_OR_DEPARTMENT_OTHER): Payer: Self-pay

## 2021-09-24 DIAGNOSIS — J4520 Mild intermittent asthma, uncomplicated: Secondary | ICD-10-CM | POA: Insufficient documentation

## 2021-09-24 MED ORDER — GABAPENTIN 400 MG PO CAPS
ORAL_CAPSULE | ORAL | 2 refills | Status: DC
  Filled 2021-09-24: qty 90, 30d supply, fill #0
  Filled 2021-11-23: qty 90, 30d supply, fill #1

## 2021-09-24 MED ORDER — ALBUTEROL SULFATE HFA 108 (90 BASE) MCG/ACT IN AERS
INHALATION_SPRAY | RESPIRATORY_TRACT | 2 refills | Status: AC
  Filled 2021-09-24: qty 18, 17d supply, fill #0
  Filled 2022-05-17: qty 18, 17d supply, fill #1

## 2021-09-24 MED ORDER — TRAMADOL HCL 50 MG PO TABS
ORAL_TABLET | ORAL | 0 refills | Status: DC
  Filled 2021-09-24: qty 20, 5d supply, fill #0

## 2021-10-18 ENCOUNTER — Other Ambulatory Visit (HOSPITAL_BASED_OUTPATIENT_CLINIC_OR_DEPARTMENT_OTHER): Payer: Self-pay

## 2021-10-19 ENCOUNTER — Other Ambulatory Visit (HOSPITAL_BASED_OUTPATIENT_CLINIC_OR_DEPARTMENT_OTHER): Payer: Self-pay

## 2021-10-19 MED ORDER — AMPHETAMINE-DEXTROAMPHETAMINE 15 MG PO TABS
ORAL_TABLET | ORAL | 0 refills | Status: DC
  Filled 2021-10-19: qty 60, 15d supply, fill #0

## 2021-10-22 ENCOUNTER — Other Ambulatory Visit (HOSPITAL_BASED_OUTPATIENT_CLINIC_OR_DEPARTMENT_OTHER): Payer: Self-pay

## 2021-11-23 ENCOUNTER — Other Ambulatory Visit (HOSPITAL_BASED_OUTPATIENT_CLINIC_OR_DEPARTMENT_OTHER): Payer: Self-pay

## 2021-11-26 ENCOUNTER — Other Ambulatory Visit (HOSPITAL_BASED_OUTPATIENT_CLINIC_OR_DEPARTMENT_OTHER): Payer: Self-pay

## 2021-11-26 MED ORDER — FLUOXETINE HCL 40 MG PO CAPS
ORAL_CAPSULE | ORAL | 1 refills | Status: DC
  Filled 2021-11-26 – 2021-11-27 (×2): qty 90, 90d supply, fill #0

## 2021-11-27 ENCOUNTER — Other Ambulatory Visit (HOSPITAL_BASED_OUTPATIENT_CLINIC_OR_DEPARTMENT_OTHER): Payer: Self-pay

## 2021-12-06 ENCOUNTER — Other Ambulatory Visit (HOSPITAL_BASED_OUTPATIENT_CLINIC_OR_DEPARTMENT_OTHER): Payer: Self-pay

## 2021-12-06 MED ORDER — AMPHETAMINE-DEXTROAMPHETAMINE 30 MG PO TABS
ORAL_TABLET | ORAL | 0 refills | Status: DC
  Filled 2021-12-06: qty 30, 15d supply, fill #0

## 2021-12-06 MED ORDER — PREGABALIN 25 MG PO CAPS
ORAL_CAPSULE | ORAL | 2 refills | Status: DC
  Filled 2021-12-06: qty 90, 45d supply, fill #0

## 2021-12-10 ENCOUNTER — Other Ambulatory Visit (HOSPITAL_BASED_OUTPATIENT_CLINIC_OR_DEPARTMENT_OTHER): Payer: Self-pay

## 2021-12-10 MED ORDER — BUSPIRONE HCL 30 MG PO TABS
ORAL_TABLET | ORAL | 1 refills | Status: DC
  Filled 2021-12-10: qty 60, 30d supply, fill #0

## 2021-12-10 MED ORDER — VITAMIN D (ERGOCALCIFEROL) 1.25 MG (50000 UNIT) PO CAPS
ORAL_CAPSULE | ORAL | 0 refills | Status: DC
  Filled 2021-12-10: qty 12, 84d supply, fill #0

## 2021-12-10 MED ORDER — VITAMIN B-12 1000 MCG PO TABS
ORAL_TABLET | ORAL | 3 refills | Status: DC
  Filled 2021-12-10: qty 130, 130d supply, fill #0

## 2021-12-11 ENCOUNTER — Other Ambulatory Visit (HOSPITAL_BASED_OUTPATIENT_CLINIC_OR_DEPARTMENT_OTHER): Payer: Self-pay

## 2021-12-12 ENCOUNTER — Other Ambulatory Visit (HOSPITAL_BASED_OUTPATIENT_CLINIC_OR_DEPARTMENT_OTHER): Payer: Self-pay

## 2021-12-28 ENCOUNTER — Other Ambulatory Visit (HOSPITAL_BASED_OUTPATIENT_CLINIC_OR_DEPARTMENT_OTHER): Payer: Self-pay

## 2021-12-31 ENCOUNTER — Other Ambulatory Visit (HOSPITAL_BASED_OUTPATIENT_CLINIC_OR_DEPARTMENT_OTHER): Payer: Self-pay

## 2022-01-01 ENCOUNTER — Emergency Department (HOSPITAL_BASED_OUTPATIENT_CLINIC_OR_DEPARTMENT_OTHER)
Admission: EM | Admit: 2022-01-01 | Discharge: 2022-01-02 | Disposition: A | Discharge status: Home / Self Care | Payer: Medicaid Other | Attending: Emergency Medicine | Admitting: Emergency Medicine

## 2022-01-01 ENCOUNTER — Encounter (HOSPITAL_BASED_OUTPATIENT_CLINIC_OR_DEPARTMENT_OTHER): Payer: Self-pay

## 2022-01-01 ENCOUNTER — Other Ambulatory Visit: Payer: Self-pay

## 2022-01-01 DIAGNOSIS — E876 Hypokalemia: Secondary | ICD-10-CM | POA: Diagnosis not present

## 2022-01-01 DIAGNOSIS — J45909 Unspecified asthma, uncomplicated: Secondary | ICD-10-CM | POA: Insufficient documentation

## 2022-01-01 DIAGNOSIS — R110 Nausea: Secondary | ICD-10-CM | POA: Insufficient documentation

## 2022-01-01 DIAGNOSIS — R1011 Right upper quadrant pain: Secondary | ICD-10-CM | POA: Insufficient documentation

## 2022-01-01 LAB — URINALYSIS, ROUTINE W REFLEX MICROSCOPIC
Bilirubin Urine: NEGATIVE
Glucose, UA: NEGATIVE mg/dL
Hgb urine dipstick: NEGATIVE
Nitrite: NEGATIVE
Protein, ur: 30 mg/dL — AB
Specific Gravity, Urine: 1.038 — ABNORMAL HIGH (ref 1.005–1.030)
pH: 6 (ref 5.0–8.0)

## 2022-01-01 LAB — COMPREHENSIVE METABOLIC PANEL
ALT: 5 U/L (ref 0–44)
AST: 13 U/L — ABNORMAL LOW (ref 15–41)
Albumin: 4.3 g/dL (ref 3.5–5.0)
Alkaline Phosphatase: 54 U/L (ref 38–126)
Anion gap: 9 (ref 5–15)
BUN: 8 mg/dL (ref 6–20)
CO2: 25 mmol/L (ref 22–32)
Calcium: 9.1 mg/dL (ref 8.9–10.3)
Chloride: 106 mmol/L (ref 98–111)
Creatinine, Ser: 0.63 mg/dL (ref 0.44–1.00)
GFR, Estimated: >60 mL/min (ref >60)
Glucose, Bld: 87 mg/dL (ref 70–99)
Potassium: 3.3 mmol/L — ABNORMAL LOW (ref 3.5–5.1)
Sodium: 140 mmol/L (ref 135–145)
Total Bilirubin: 0.4 mg/dL (ref 0.3–1.2)
Total Protein: 6.5 g/dL (ref 6.5–8.1)

## 2022-01-01 LAB — CBC
HCT: 37.6 % (ref 36.0–46.0)
Hemoglobin: 13 g/dL (ref 12.0–15.0)
MCH: 30.7 pg (ref 26.0–34.0)
MCHC: 34.6 g/dL (ref 30.0–36.0)
MCV: 88.9 fL (ref 80.0–100.0)
Platelets: 239 10*3/uL (ref 150–400)
RBC: 4.23 MIL/uL (ref 3.87–5.11)
RDW: 12.6 % (ref 11.5–15.5)
WBC: 12.5 10*3/uL — ABNORMAL HIGH (ref 4.0–10.5)
nRBC: 0 % (ref 0.0–0.2)

## 2022-01-01 LAB — LIPASE, BLOOD: Lipase: 20 U/L (ref 11–51)

## 2022-01-01 LAB — PREGNANCY, URINE: Preg Test, Ur: NEGATIVE

## 2022-01-01 NOTE — ED Triage Notes (Signed)
Patient here POV from Home with ABD Pain. ? ?Patient endorses RUQ Pain that began 5 days PTA. Worsened since it began. Radiates to Back. Constant in nature but Intermittent in Intensity.  ? ?Mild Nausea. No Emesis. No Diarrhea. No Constipation. No Fevers. No Discernable Urinary Symptoms. ? ?NAD Noted during Triage. A&Ox4. GCS 15. Ambulatory. ?

## 2022-01-02 ENCOUNTER — Encounter (HOSPITAL_BASED_OUTPATIENT_CLINIC_OR_DEPARTMENT_OTHER): Payer: Self-pay

## 2022-01-02 ENCOUNTER — Other Ambulatory Visit: Payer: Self-pay

## 2022-01-02 ENCOUNTER — Emergency Department (HOSPITAL_BASED_OUTPATIENT_CLINIC_OR_DEPARTMENT_OTHER): Payer: Medicaid Other

## 2022-01-02 ENCOUNTER — Emergency Department (HOSPITAL_BASED_OUTPATIENT_CLINIC_OR_DEPARTMENT_OTHER): Admit: 2022-01-02 | Payer: Medicaid Other

## 2022-01-02 ENCOUNTER — Observation Stay (HOSPITAL_BASED_OUTPATIENT_CLINIC_OR_DEPARTMENT_OTHER)
Admission: EM | Admit: 2022-01-02 | Discharge: 2022-01-04 | Disposition: A | Discharge status: Home / Self Care | Payer: Medicaid Other | Attending: Surgery | Admitting: Surgery

## 2022-01-02 ENCOUNTER — Other Ambulatory Visit (HOSPITAL_BASED_OUTPATIENT_CLINIC_OR_DEPARTMENT_OTHER): Payer: Self-pay

## 2022-01-02 DIAGNOSIS — F17210 Nicotine dependence, cigarettes, uncomplicated: Secondary | ICD-10-CM | POA: Diagnosis not present

## 2022-01-02 DIAGNOSIS — R1011 Right upper quadrant pain: Secondary | ICD-10-CM | POA: Diagnosis present

## 2022-01-02 DIAGNOSIS — J45909 Unspecified asthma, uncomplicated: Secondary | ICD-10-CM | POA: Diagnosis not present

## 2022-01-02 DIAGNOSIS — R52 Pain, unspecified: Secondary | ICD-10-CM

## 2022-01-02 DIAGNOSIS — K8020 Calculus of gallbladder without cholecystitis without obstruction: Secondary | ICD-10-CM | POA: Diagnosis present

## 2022-01-02 DIAGNOSIS — Z79899 Other long term (current) drug therapy: Secondary | ICD-10-CM | POA: Insufficient documentation

## 2022-01-02 DIAGNOSIS — Z20822 Contact with and (suspected) exposure to covid-19: Secondary | ICD-10-CM | POA: Diagnosis not present

## 2022-01-02 DIAGNOSIS — Z419 Encounter for procedure for purposes other than remedying health state, unspecified: Secondary | ICD-10-CM

## 2022-01-02 DIAGNOSIS — K8010 Calculus of gallbladder with chronic cholecystitis without obstruction: Secondary | ICD-10-CM | POA: Diagnosis not present

## 2022-01-02 DIAGNOSIS — K8050 Calculus of bile duct without cholangitis or cholecystitis without obstruction: Secondary | ICD-10-CM | POA: Diagnosis present

## 2022-01-02 LAB — COMPREHENSIVE METABOLIC PANEL
ALT: <5 U/L (ref 0–44)
AST: 12 U/L — ABNORMAL LOW (ref 15–41)
Albumin: 4 g/dL (ref 3.5–5.0)
Alkaline Phosphatase: 57 U/L (ref 38–126)
Anion gap: 8 (ref 5–15)
BUN: 8 mg/dL (ref 6–20)
CO2: 25 mmol/L (ref 22–32)
Calcium: 8.8 mg/dL — ABNORMAL LOW (ref 8.9–10.3)
Chloride: 108 mmol/L (ref 98–111)
Creatinine, Ser: 0.59 mg/dL (ref 0.44–1.00)
GFR, Estimated: >60 mL/min (ref >60)
Glucose, Bld: 86 mg/dL (ref 70–99)
Potassium: 3.9 mmol/L (ref 3.5–5.1)
Sodium: 141 mmol/L (ref 135–145)
Total Bilirubin: 0.3 mg/dL (ref 0.3–1.2)
Total Protein: 6.4 g/dL — ABNORMAL LOW (ref 6.5–8.1)

## 2022-01-02 LAB — CBC
HCT: 39.4 % (ref 36.0–46.0)
Hemoglobin: 13.5 g/dL (ref 12.0–15.0)
MCH: 30.9 pg (ref 26.0–34.0)
MCHC: 34.3 g/dL (ref 30.0–36.0)
MCV: 90.2 fL (ref 80.0–100.0)
Platelets: 200 10*3/uL (ref 150–400)
RBC: 4.37 MIL/uL (ref 3.87–5.11)
RDW: 12.7 % (ref 11.5–15.5)
WBC: 7.8 10*3/uL (ref 4.0–10.5)
nRBC: 0 % (ref 0.0–0.2)

## 2022-01-02 LAB — RESP PANEL BY RT-PCR (FLU A&B, COVID) ARPGX2
Influenza A by PCR: NEGATIVE
Influenza B by PCR: NEGATIVE
SARS Coronavirus 2 by RT PCR: NEGATIVE

## 2022-01-02 LAB — LIPASE, BLOOD: Lipase: 31 U/L (ref 11–51)

## 2022-01-02 MED ORDER — HYDROMORPHONE HCL 1 MG/ML IJ SOLN
0.5000 mg | INTRAMUSCULAR | Status: DC | PRN
  Administered 2022-01-03 – 2022-01-04 (×9): 0.5 mg via INTRAVENOUS
  Filled 2022-01-02 (×9): qty 0.5

## 2022-01-02 MED ORDER — DOCUSATE SODIUM 100 MG PO CAPS
100.0000 mg | ORAL_CAPSULE | Freq: Two times a day (BID) | ORAL | Status: DC
  Administered 2022-01-02 – 2022-01-04 (×3): 100 mg via ORAL
  Filled 2022-01-02 (×3): qty 1

## 2022-01-02 MED ORDER — OXYCODONE-ACETAMINOPHEN 5-325 MG PO TABS
1.0000 | ORAL_TABLET | Freq: Once | ORAL | Status: AC
Start: 2022-01-02 — End: 2022-01-02
  Administered 2022-01-02: 1 via ORAL
  Filled 2022-01-02: qty 1

## 2022-01-02 MED ORDER — POTASSIUM CHLORIDE CRYS ER 20 MEQ PO TBCR
20.0000 meq | EXTENDED_RELEASE_TABLET | Freq: Two times a day (BID) | ORAL | 0 refills | Status: DC
Start: 2022-01-02 — End: 2022-02-24
  Filled 2022-01-02: qty 10, 5d supply, fill #0

## 2022-01-02 MED ORDER — ENOXAPARIN SODIUM 40 MG/0.4ML IJ SOSY
40.0000 mg | PREFILLED_SYRINGE | INTRAMUSCULAR | Status: DC
  Administered 2022-01-02 – 2022-01-03 (×2): 40 mg via SUBCUTANEOUS
  Filled 2022-01-02 (×2): qty 0.4

## 2022-01-02 MED ORDER — DIPHENHYDRAMINE HCL 25 MG PO CAPS
25.0000 mg | ORAL_CAPSULE | Freq: Four times a day (QID) | ORAL | Status: DC | PRN
  Administered 2022-01-03: 25 mg via ORAL
  Filled 2022-01-02: qty 1

## 2022-01-02 MED ORDER — ONDANSETRON 4 MG PO TBDP
8.0000 mg | ORAL_TABLET | Freq: Once | ORAL | Status: AC
  Administered 2022-01-02: 8 mg via ORAL
  Filled 2022-01-02: qty 2

## 2022-01-02 MED ORDER — ALBUTEROL SULFATE (2.5 MG/3ML) 0.083% IN NEBU: 2.5000 mg | INHALATION_SOLUTION | RESPIRATORY_TRACT | Status: DC | PRN

## 2022-01-02 MED ORDER — PREGABALIN 25 MG PO CAPS
25.0000 mg | ORAL_CAPSULE | Freq: Two times a day (BID) | ORAL | Status: DC
  Administered 2022-01-02 – 2022-01-04 (×3): 25 mg via ORAL
  Filled 2022-01-02 (×3): qty 1

## 2022-01-02 MED ORDER — SODIUM CHLORIDE 0.9 % IV SOLN: INTRAVENOUS | Status: DC

## 2022-01-02 MED ORDER — BUSPIRONE HCL 5 MG PO TABS
30.0000 mg | ORAL_TABLET | Freq: Two times a day (BID) | ORAL | Status: DC
  Administered 2022-01-02 – 2022-01-04 (×3): 30 mg via ORAL
  Filled 2022-01-02 (×3): qty 6

## 2022-01-02 MED ORDER — ONDANSETRON HCL 4 MG/2ML IJ SOLN
4.0000 mg | Freq: Three times a day (TID) | INTRAMUSCULAR | Status: DC | PRN
  Administered 2022-01-02: 4 mg via INTRAVENOUS
  Filled 2022-01-02: qty 2

## 2022-01-02 MED ORDER — ACETAMINOPHEN 500 MG PO TABS
1000.0000 mg | ORAL_TABLET | Freq: Four times a day (QID) | ORAL | Status: DC
  Administered 2022-01-02 – 2022-01-04 (×3): 1000 mg via ORAL
  Filled 2022-01-02 (×3): qty 2

## 2022-01-02 MED ORDER — DIPHENHYDRAMINE HCL 50 MG/ML IJ SOLN: 25.0000 mg | Freq: Four times a day (QID) | INTRAMUSCULAR | Status: DC | PRN

## 2022-01-02 MED ORDER — SODIUM CHLORIDE 0.9 % IV BOLUS
1000.0000 mL | Freq: Once | INTRAVENOUS | Status: AC
  Administered 2022-01-02: 1000 mL via INTRAVENOUS

## 2022-01-02 MED ORDER — HYDROMORPHONE HCL 1 MG/ML IJ SOLN: 0.5000 mg | INTRAMUSCULAR | Status: DC | PRN

## 2022-01-02 MED ORDER — ONDANSETRON HCL 4 MG/2ML IJ SOLN: 4.0000 mg | Freq: Four times a day (QID) | INTRAMUSCULAR | Status: DC | PRN

## 2022-01-02 MED ORDER — FLUOXETINE HCL 20 MG PO CAPS
40.0000 mg | ORAL_CAPSULE | Freq: Every day | ORAL | Status: DC
  Administered 2022-01-04: 40 mg via ORAL
  Filled 2022-01-02: qty 2

## 2022-01-02 MED ORDER — ONDANSETRON 4 MG PO TBDP: 4.0000 mg | ORAL_TABLET | Freq: Four times a day (QID) | ORAL | Status: DC | PRN

## 2022-01-02 MED ORDER — POTASSIUM CHLORIDE CRYS ER 20 MEQ PO TBCR
40.0000 meq | EXTENDED_RELEASE_TABLET | Freq: Once | ORAL | Status: AC
  Administered 2022-01-02: 40 meq via ORAL
  Filled 2022-01-02: qty 2

## 2022-01-02 MED ORDER — HYDROMORPHONE HCL 1 MG/ML IJ SOLN
0.5000 mg | Freq: Once | INTRAMUSCULAR | Status: AC
  Administered 2022-01-02: 0.5 mg via INTRAVENOUS
  Filled 2022-01-02: qty 1

## 2022-01-02 NOTE — ED Notes (Signed)
Patient has opted to remain in Emergency Department and be evaluated by EDP today. ?

## 2022-01-02 NOTE — ED Provider Notes (Signed)
?[facility] EMERGENCY DEPT ?Provider Note ? ? ?CSN: [id number] ?Arrival date & time: 01/01/22  2253 ? ?  ? ?History ? ?Chief Complaint  ?Patient presents with  ? Abdominal Pain  ? ? ?Illene Silvermma Leann [name] is a 21 y.o. female. ? ?The history is provided by the patient.  ?Abdominal Pain ?She has history of eczema, asthma, bipolar disorder and comes in because of right upper quadrant pain for the last 5 days.  Pain is constant and worse when eating.  Nothing else seems to affect it.  There has been nausea but no vomiting.  She denies fever or chills but has been feeling alternately hot and cold.  She denies constipation or diarrhea.  She has not done anything to treat her pain.  She is never had pain like this before.  She currently rates pain at 7/10. ?  ?Home Medications ?Prior to Admission medications   ?Medication Sig Start Date End Date Taking? Authorizing Provider  ?albuterol (VENTOLIN HFA) 108 (90 Base) MCG/ACT inhaler Inhale two puffs into the lungs every 4 (four) hours as needed for Wheezing or Shortness of Breath. 09/24/21     ?amphetamine-dextroamphetamine (ADDERALL) 10 MG tablet Take one tablet (10 mg dose) by mouth 2 (two) times daily for 30 days. 05/11/21     ?amphetamine-dextroamphetamine (ADDERALL) 15 MG tablet Take 2 tablets by mouth twice daily 10/19/21     ?amphetamine-dextroamphetamine (ADDERALL) 20 MG tablet Take 1 tablet (20 mg dose) by mouth 2 (two) times daily. 06/15/21     ?amphetamine-dextroamphetamine (ADDERALL) 30 MG tablet Take one tablet (30 mg dose) by mouth 2 (two) times daily. 12/06/21     ?busPIRone (BUSPAR) 10 MG tablet Take one tablet (10 mg dose) by mouth 2 (two) times a day as needed. 04/11/21     ?busPIRone (BUSPAR) 15 MG tablet Take one tablet (15 mg dose) by mouth 3 (three) times a day. 05/11/21     ?busPIRone (BUSPAR) 30 MG tablet Take one tablet (30 mg dose) by mouth 2 (two) times daily. 12/06/21     ?Vitamin D, Ergocalciferol, (DRISDOL) 1.25 MG (50000 UNIT) CAPS capsule  Take 1 capsule by mouth once a week. 12/10/21     ?EPINEPHrine 0.3 mg/0.3 mL IJ SOAJ injection Inject 0.3 mg into the muscle as needed for anaphylaxis. 08/16/20   Allayne StackBeard, Samantha N, DO  ?FLUoxetine (PROZAC) 10 MG capsule Take 1 capsule (10 mg dose) by mouth daily. After two weeks, take 2 capsules by mouth daily. 04/11/21     ?FLUoxetine (PROZAC) 40 MG capsule Take one capsule (40 mg dose) by mouth daily. 11/26/21     ?gabapentin (NEURONTIN) 300 MG capsule Take one capsule by mouth at night x 7 days, then one capsule by mouth twice a day x 7 days, then one capsule three times a day. 09/20/21     ?gabapentin (NEURONTIN) 400 MG capsule Take one capsule (400 mg dose) by mouth 3 (three) times a day. 09/24/21     ?meloxicam (MOBIC) 15 MG tablet Take 1 tablet (15 mg dose) by mouth daily. 06/15/21     ?OLANZapine (ZYPREXA) 2.5 MG tablet Take one tablet (2.5 mg dose) by mouth at bedtime. 07/27/21     ?pregabalin (LYRICA) 25 MG capsule Take one capsule (25 mg dose) by mouth 2 (two) times daily. Max Daily Amount: 50 mg 12/06/21     ?traMADol (ULTRAM) 50 MG tablet Take one tablet (50 mg dose) by mouth every 6 (six) hours as needed for Pain for up to  5 days. 05/31/21     ?traMADol (ULTRAM) 50 MG tablet Take one tablet (50 mg dose) by mouth every 6 (six) hours as needed for Pain for up to 5 days. Max Daily Amount: 200 mg 09/24/21     ?triamcinolone cream (KENALOG) 0.1 % Apply topically 2 (two) times daily. 05/11/21     ?vitamin B-12 (CYANOCOBALAMIN) 1000 MCG tablet Take one tablet (1,000 mcg dose) by mouth daily. 12/10/21     ?   ? ?Allergies    ?Gabapentin and Mobic [meloxicam]   ? ?Review of Systems   ?Review of Systems  ?Gastrointestinal:  Positive for abdominal pain.  ?All other systems reviewed and are negative. ? ?Physical Exam ?Updated Vital Signs ?BP (!) 114/95 (BP Location: Right Arm)   Pulse (!) 106   Temp 97.8 ?F (36.6 ?C)   Resp 18   Ht 5\' 4"  (1.626 m)   Wt 99.8 kg   SpO2 96%   BMI 37.76 kg/m?  ?Physical Exam ?Vitals and  nursing note reviewed.  ?21 year old female, resting comfortably and in no acute distress. Vital signs are significant for mildly elevated diastolic blood pressure and mildly elevated heart rate. Oxygen saturation is 96%, which is normal. ?Head is normocephalic and atraumatic. PERRLA, EOMI. Oropharynx is clear. ?Neck is nontender and supple without adenopathy or JVD. ?Back is nontender and there is no CVA tenderness. ?Lungs are clear without rales, wheezes, or rhonchi. ?Chest is nontender. ?Heart has regular rate and rhythm without murmur. ?Abdomen is soft, flat, with mild epigastric tenderness and moderate right upper quadrant tenderness with a positive Murphy sign.  There is no rebound or guarding.  There are no masses or hepatosplenomegaly.  Peristalsis is present. ?Extremities have no cyanosis or edema, full range of motion is present. ?Skin is warm and dry without rash. ?Neurologic: Mental status is normal, cranial nerves are intact, moves all extremities equally. ? ?ED Results / Procedures / Treatments   ?Labs ?(all labs ordered are listed, but only abnormal results are displayed) ?Labs Reviewed  ?COMPREHENSIVE METABOLIC PANEL - Abnormal; Notable for the following components:  ?    Result Value  ? Potassium 3.3 (*)   ? AST 13 (*)   ? All other components within normal limits  ?CBC - Abnormal; Notable for the following components:  ? WBC 12.5 (*)   ? All other components within normal limits  ?URINALYSIS, ROUTINE W REFLEX MICROSCOPIC - Abnormal; Notable for the following components:  ? Specific Gravity, Urine 1.038 (*)   ? Ketones, ur TRACE (*)   ? Protein, ur 30 (*)   ? Leukocytes,Ua SMALL (*)   ? Bacteria, UA RARE (*)   ? All other components within normal limits  ?LIPASE, BLOOD  ?PREGNANCY, URINE  ? ?Procedures ?Procedures  ? ? ?Medications Ordered in ED ?Medications  ?ondansetron (ZOFRAN-ODT) disintegrating tablet 8 mg (has no administration in time range)  ?potassium chloride SA (KLOR-CON M) CR tablet 40  mEq (40 mEq Oral Given 01/02/22 0148)  ?oxyCODONE-acetaminophen (PERCOCET/ROXICET) 5-325 MG per tablet 1 tablet (1 tablet Oral Given 01/02/22 0148)  ? ? ?ED Course/ Medical Decision Making/ A&P ?  ?                        ?Medical Decision Making ?Amount and/or Complexity of Data Reviewed ?Labs: ordered. ?Radiology: ordered. ? ?Risk ?Prescription drug management. ? ? ?Right upper quadrant pain suspicious for biliary colic.  Consider peptic ulcer disease, pancreatitis, gastritis, diverticulitis.  Screening labs are obtained and WBC is minimally elevated, this is nonspecific.  Metabolic panel shows normal transaminases, alkaline phosphatase, bilirubin.  Mild hypokalemia is present and she is given a dose of oral potassium.  Old records are reviewed, and she has no relevant past visits.  She will need to have right upper quadrant ultrasound to evaluate for possible biliary colic.  She is nontoxic in appearance and I do feel she is safe for discharge to obtain ultrasound as an outpatient.  She is given a dose of oxycodone-acetaminophen for pain, and also given a dose of ondansetron for nausea and K-Dur for hypokalemia. ? ?She had reasonable relief of pain with above-noted treatment.  She is discharged with instructions to return in the morning for right upper quadrant ultrasound.  Also given prescription for K-Dur for her hypokalemia. ? ? ? ? ? ? ? ?Final Clinical Impression(s) / ED Diagnoses ?Final diagnoses:  ?RUQ pain  ?Hypokalemia  ? ? ?Rx / DC Orders ?ED Discharge Orders   ? ?      Ordered  ?  US Abdomen Limited RUQ/Gall Bladder       ? 01/02/22 0147  ?  potassium chloride SA (KLOR-CON M) 20 MEQ tablet  2 times daily       ? 01/02/22 0230  ? ?  ?  ? ?  ? ? ?  ?Dione Booze, MD ?01/02/22 [phone] ? ?

## 2022-01-02 NOTE — ED Triage Notes (Signed)
Patient here POV from Home with ABD Pain. ? ?Patient was seen and evaluated yesterday and discharged for Same. Patient returns today for [place] ABD to assess GB.  ? ?[place] consulted and Patient recommended to have [place] completed tomorrow but Patient requests to have [place] completed today in ED.  ? ?NAD Noted during Triage. A&Ox4. GCS 15. Ambulatory. ?

## 2022-01-02 NOTE — Discharge Instructions (Addendum)
Return in the morning for an ultrasound to look at your gallbladder. ?

## 2022-01-02 NOTE — H&P (Addendum)
? ?Anita [name] ?26-May-2001  ?[id number].   ? ?Chief Complaint/Reason for Consult: RUQ pain, gallstones ? ?HPI:  ?Anita [name] is a 21 yo female who presented to the [facility] ED last night with abdominal pain. The pain started about 5 days ago in the right upper quadrant, and has progressively worsened since then. She has not had pain like this before. It did not start after a meal. Labs last night showed a mildly elevated WBC of 12.5. LFTs were normal. She was treated for pain and discharged home, and returned today to get an [place]. This showed cholelithiasis without signs of acute cholecystitis. WBC today is normal. Her pain has persisted and general surgery was consulted for admission.  ? ?She has not had any prior abdominal surgeries.  ? ?ROS: ?Review of Systems  ?Constitutional:  Negative for chills and fever.  ?HENT:  Negative for sore throat.   ?Eyes:  Negative for pain and redness.  ?Respiratory:  Negative for shortness of breath, wheezing and stridor.   ?Cardiovascular:  Negative for chest pain.  ?Gastrointestinal:  Positive for abdominal pain. Negative for nausea and vomiting.  ?Musculoskeletal:  Negative for joint pain.  ?Skin:  Negative for itching.  ?Neurological:  Negative for loss of consciousness and weakness.  ?Endo/Heme/Allergies:  Does not bruise/bleed easily.  ? ?Family History  ?Problem Relation Age of Onset  ? Alcohol abuse Mother   ? Depression Mother   ? Thyroid disease Mother   ? Heart attack Mother 32  ?     died of heart attack  ? Bipolar disorder Mother   ? Asthma Mother   ? Vision loss Sister   ? Asthma Father   ?     as a child  ? Hypertension Maternal Grandfather   ? ? ?Past Medical History:  ?Diagnosis Date  ? Anxiety   ? Asthma   ? age 55  ? Bipolar disorder (HCC)   ? Eczema   ? Migraines   ? ? ?Past Surgical History:  ?Procedure Laterality Date  ? KNEE SURGERY  01/23/2015  ? L knee torn meniscus and ACL  ? TONSILLECTOMY AND ADENOIDECTOMY  2012  ? ? ?Social History:  reports  that she has been smoking cigarettes. She has never used smokeless tobacco. She reports that she does not currently use alcohol. She reports current drug use. Frequency: 7.00 times per week. Drug: Marijuana. ? ?Allergies:  ?Allergies  ?Allergen Reactions  ? Gabapentin Other (See Comments)  ?  Sleep  ? Mobic [Meloxicam] Nausea Only  ? ? ?Medications Prior to Admission  ?Medication Sig Dispense Refill  ? albuterol (VENTOLIN HFA) 108 (90 Base) MCG/ACT inhaler Inhale two puffs into the lungs every 4 (four) hours as needed for Wheezing or Shortness of Breath. 18 g 2  ? amphetamine-dextroamphetamine (ADDERALL) 10 MG tablet Take one tablet (10 mg dose) by mouth 2 (two) times daily for 30 days. 60 tablet 0  ? amphetamine-dextroamphetamine (ADDERALL) 15 MG tablet Take 2 tablets by mouth twice daily 60 tablet 0  ? amphetamine-dextroamphetamine (ADDERALL) 20 MG tablet Take 1 tablet (20 mg dose) by mouth 2 (two) times daily. 60 tablet 0  ? amphetamine-dextroamphetamine (ADDERALL) 30 MG tablet Take one tablet (30 mg dose) by mouth 2 (two) times daily. 30 tablet 0  ? busPIRone (BUSPAR) 10 MG tablet Take one tablet (10 mg dose) by mouth 2 (two) times a day as needed. 180 tablet 1  ? busPIRone (BUSPAR) 15 MG tablet Take one tablet (15 mg dose)  by mouth 3 (three) times a day. 90 tablet 11  ? busPIRone (BUSPAR) 30 MG tablet Take one tablet (30 mg dose) by mouth 2 (two) times daily. 180 tablet 1  ? Vitamin D, Ergocalciferol, (DRISDOL) 1.25 MG (50000 UNIT) CAPS capsule Take 1 capsule by mouth once a week. 12 capsule 0  ? EPINEPHrine 0.3 mg/0.3 mL IJ SOAJ injection Inject 0.3 mg into the muscle as needed for anaphylaxis. 1 each 0  ? FLUoxetine (PROZAC) 10 MG capsule Take 1 capsule (10 mg dose) by mouth daily. After two weeks, take 2 capsules by mouth daily. 90 capsule 2  ? FLUoxetine (PROZAC) 40 MG capsule Take one capsule (40 mg dose) by mouth daily. 90 capsule 1  ? gabapentin (NEURONTIN) 300 MG capsule Take one capsule by mouth at  night x 7 days, then one capsule by mouth twice a day x 7 days, then one capsule three times a day. 90 capsule 0  ? gabapentin (NEURONTIN) 400 MG capsule Take one capsule (400 mg dose) by mouth 3 (three) times a day. 90 capsule 2  ? meloxicam (MOBIC) 15 MG tablet Take 1 tablet (15 mg dose) by mouth daily. 30 tablet 2  ? OLANZapine (ZYPREXA) 2.5 MG tablet Take one tablet (2.5 mg dose) by mouth at bedtime. 30 tablet 5  ? potassium chloride SA (KLOR-CON M) 20 MEQ tablet Take 1 tablet (20 mEq total) by mouth 2 (two) times daily. 10 tablet 0  ? pregabalin (LYRICA) 25 MG capsule Take one capsule (25 mg dose) by mouth 2 (two) times daily. Max Daily Amount: 50 mg 90 capsule 2  ? traMADol (ULTRAM) 50 MG tablet Take one tablet (50 mg dose) by mouth every 6 (six) hours as needed for Pain for up to 5 days. 20 tablet 0  ? traMADol (ULTRAM) 50 MG tablet Take one tablet (50 mg dose) by mouth every 6 (six) hours as needed for Pain for up to 5 days. Max Daily Amount: 200 mg 20 tablet 0  ? triamcinolone cream (KENALOG) 0.1 % Apply topically 2 (two) times daily. 80 g 1  ? vitamin B-12 (CYANOCOBALAMIN) 1000 MCG tablet Take one tablet (1,000 mcg dose) by mouth daily. 130 tablet 3  ? ? ? ?Physical Exam: ?Blood pressure (!) 93/55, pulse 65, temperature 98.2 ?F (36.8 ?C), temperature source Oral, resp. rate 16, height 5\' 4"  (1.626 m), weight 101.7 kg, SpO2 100 %. ?General: resting comfortably, appears stated age, no apparent distress ?Neurological: alert and oriented, no focal deficits, cranial nerves grossly in tact ?HEENT: normocephalic, atraumatic, oropharynx clear, no scleral icterus ?CV: regular rate and rhythm, extremities warm and well-perfused ?Respiratory: normal work of breathing on room air, symmetric chest wall expansion ?Abdomen: soft, nondistended, focally tender to palpation in the RUQ. No masses or organomegaly. No surgical scars. ?Extremities: warm and well-perfused, no deformities, moving all extremities  spontaneously ?Psychiatric: normal mood and affect ?Skin: warm and dry, no jaundice, no rashes or lesions ? ? ?Results for orders placed or performed during the hospital encounter of 01/02/22 (from the past 48 hour(s))  ?CBC     Status: None  ? Collection Time: 01/02/22  8:11 PM  ?Result Value Ref Range  ? WBC 7.8 4.0 - 10.5 K/uL  ? RBC 4.37 3.87 - 5.11 MIL/uL  ? Hemoglobin 13.5 12.0 - 15.0 g/dL  ? HCT 39.4 36.0 - 46.0 %  ? MCV 90.2 80.0 - 100.0 fL  ? MCH 30.9 26.0 - 34.0 pg  ? MCHC 34.3 30.0 -  36.0 g/dL  ? RDW 12.7 11.5 - 15.5 %  ? Platelets 200 150 - 400 K/uL  ? nRBC 0.0 0.0 - 0.2 %  ?  Comment: Performed at [facility], [address], [place], [place] [address]  ?Comprehensive metabolic panel     Status: Abnormal  ? Collection Time: 01/02/22  8:11 PM  ?Result Value Ref Range  ? Sodium 141 135 - 145 mmol/L  ? Potassium 3.9 3.5 - 5.1 mmol/L  ? Chloride 108 98 - 111 mmol/L  ? CO2 25 22 - 32 mmol/L  ? Glucose, Bld 86 70 - 99 mg/dL  ?  Comment: Glucose reference range applies only to samples taken after fasting for at least 8 hours.  ? BUN 8 6 - 20 mg/dL  ? Creatinine, Ser 0.59 0.44 - 1.00 mg/dL  ? Calcium 8.8 (L) 8.9 - 10.3 mg/dL  ? Total Protein 6.4 (L) 6.5 - 8.1 g/dL  ? Albumin 4.0 3.5 - 5.0 g/dL  ? AST 12 (L) 15 - 41 U/L  ? ALT <5 0 - 44 U/L  ? Alkaline Phosphatase 57 38 - 126 U/L  ? Total Bilirubin 0.3 0.3 - 1.2 mg/dL  ? GFR, Estimated >60 >60 mL/min  ?  Comment: (NOTE) ?Calculated using the CKD-EPI Creatinine Equation (2021) ?  ? Anion gap 8 5 - 15  ?  Comment: Performed at [facility], [address], [place], [place] [address]  ?Lipase, blood     Status: None  ? Collection Time: 01/02/22  8:11 PM  ?Result Value Ref Range  ? Lipase 31 11 - 51 U/L  ?  Comment: Performed at [facility], [address], [place], [place] [address]  ?Resp Panel by RT-PCR (Flu A&B, Covid) Nasopharyngeal Swab     Status: None  ? Collection Time: 01/02/22  8:11 PM  ?  Specimen: Nasopharyngeal Swab; Nasopharyngeal(NP) swabs in vial transport medium  ?Result Value Ref Range  ? SARS Coronavirus 2 by RT PCR NEGATIVE NEGATIVE  ?  Comment: (NOTE) ?SARS-CoV-2 target nucleic acids are NOT DETECTED. ?

## 2022-01-02 NOTE — ED Notes (Signed)
US bedside

## 2022-01-02 NOTE — ED Provider Notes (Signed)
?[facility] EM[facility] DEPT ?Provider Note ? ? ?CSN: [id number] ?Arrival date & time: 01/02/22  1520 ? ?  ? ?History ? ?Chief Complaint  ?Patient presents with  ? Abdominal Pain  ? ? ?Anita [name] is a 21 y.o. female. ? ?Patient c/o ruq pain in past week. Symptoms at rest, constant, dull, non radiating. Not consistently exacerbating by eating, normal appetite. No vomiting. Having normal bms. No abd distension. No dysuria or hematuria. No mid to lower abd pain, no pelvic pain. No fever or chills. No vaginal discharge/bleeding. No cough or uri symptoms. No chest pain or sob. No hx gallstones. Was in ED last night for same, and told to return for u/s today.  ? ?The history is provided by the patient and medical records.  ?Abdominal Pain ?Associated symptoms: no chest pain, no cough, no dysuria, no fever, no hematuria, no shortness of breath, no sore throat and no vomiting   ? ?  ? ?Home Medications ?Prior to Admission medications   ?Medication Sig Start Date End Date Taking? Authorizing Provider  ?albuterol (VENTOLIN HFA) 108 (90 Base) MCG/ACT inhaler Inhale two puffs into the lungs every 4 (four) hours as needed for Wheezing or Shortness of Breath. 09/24/21     ?amphetamine-dextroamphetamine (ADDERALL) 10 MG tablet Take one tablet (10 mg dose) by mouth 2 (two) times daily for 30 days. 05/11/21     ?amphetamine-dextroamphetamine (ADDERALL) 15 MG tablet Take 2 tablets by mouth twice daily 10/19/21     ?amphetamine-dextroamphetamine (ADDERALL) 20 MG tablet Take 1 tablet (20 mg dose) by mouth 2 (two) times daily. 06/15/21     ?amphetamine-dextroamphetamine (ADDERALL) 30 MG tablet Take one tablet (30 mg dose) by mouth 2 (two) times daily. 12/06/21     ?busPIRone (BUSPAR) 10 MG tablet Take one tablet (10 mg dose) by mouth 2 (two) times a day as needed. 04/11/21     ?busPIRone (BUSPAR) 15 MG tablet Take one tablet (15 mg dose) by mouth 3 (three) times a day. 05/11/21     ?busPIRone (BUSPAR) 30 MG tablet Take one  tablet (30 mg dose) by mouth 2 (two) times daily. 12/06/21     ?Vitamin D, Ergocalciferol, (DRISDOL) 1.25 MG (50000 UNIT) CAPS capsule Take 1 capsule by mouth once a week. 12/10/21     ?EPINEPHrine 0.3 mg/0.3 mL IJ SOAJ injection Inject 0.3 mg into the muscle as needed for anaphylaxis. 08/16/20   Allayne StackBeard, Samantha N, DO  ?FLUoxetine (PROZAC) 10 MG capsule Take 1 capsule (10 mg dose) by mouth daily. After two weeks, take 2 capsules by mouth daily. 04/11/21     ?FLUoxetine (PROZAC) 40 MG capsule Take one capsule (40 mg dose) by mouth daily. 11/26/21     ?gabapentin (NEURONTIN) 300 MG capsule Take one capsule by mouth at night x 7 days, then one capsule by mouth twice a day x 7 days, then one capsule three times a day. 09/20/21     ?gabapentin (NEURONTIN) 400 MG capsule Take one capsule (400 mg dose) by mouth 3 (three) times a day. 09/24/21     ?meloxicam (MOBIC) 15 MG tablet Take 1 tablet (15 mg dose) by mouth daily. 06/15/21     ?OLANZapine (ZYPREXA) 2.5 MG tablet Take one tablet (2.5 mg dose) by mouth at bedtime. 07/27/21     ?potassium chloride SA (KLOR-CON M) 20 MEQ tablet Take 1 tablet (20 mEq total) by mouth 2 (two) times daily. 01/02/22   Dione BoozeGlick, David, MD  ?pregabalin (LYRICA) 25 MG capsule Take one  capsule (25 mg dose) by mouth 2 (two) times daily. Max Daily Amount: 50 mg 12/06/21     ?traMADol (ULTRAM) 50 MG tablet Take one tablet (50 mg dose) by mouth every 6 (six) hours as needed for Pain for up to 5 days. 05/31/21     ?traMADol (ULTRAM) 50 MG tablet Take one tablet (50 mg dose) by mouth every 6 (six) hours as needed for Pain for up to 5 days. Max Daily Amount: 200 mg 09/24/21     ?triamcinolone cream (KENALOG) 0.1 % Apply topically 2 (two) times daily. 05/11/21     ?vitamin B-12 (CYANOCOBALAMIN) 1000 MCG tablet Take one tablet (1,000 mcg dose) by mouth daily. 12/10/21     ?   ? ?Allergies    ?Gabapentin and Mobic [meloxicam]   ? ?Review of Systems   ?Review of Systems  ?Constitutional:  Negative for fever.  ?HENT:   Negative for sore throat.   ?Eyes:  Negative for redness.  ?Respiratory:  Negative for cough and shortness of breath.   ?Cardiovascular:  Negative for chest pain.  ?Gastrointestinal:  Positive for abdominal pain. Negative for vomiting.  ?Genitourinary:  Negative for dysuria and hematuria.  ?Musculoskeletal:  Negative for back pain.  ?Skin:  Negative for rash.  ?Neurological:  Negative for headaches.  ?Hematological:  Does not bruise/bleed easily.  ?Psychiatric/Behavioral:  Negative for confusion.   ? ?Physical Exam ?Updated Vital Signs ?BP 105/61 (BP Location: Right Arm)   Pulse 77   Temp 98.6 ?F (37 ?C) (Oral)   Resp 12   Ht 1.626 m (5\' 4" )   Wt 99.8 kg   SpO2 99%   BMI 37.77 kg/m?  ?Physical Exam ?Vitals and nursing note reviewed.  ?Constitutional:   ?   Appearance: Normal appearance. She is well-developed.  ?HENT:  ?   Head: Atraumatic.  ?   Nose: Nose normal.  ?   Mouth/Throat:  ?   Mouth: Mucous membranes are moist.  ?Eyes:  ?   General: No scleral icterus. ?   Conjunctiva/sclera: Conjunctivae normal.  ?Neck:  ?   Trachea: No tracheal deviation.  ?Cardiovascular:  ?   Rate and Rhythm: Normal rate and regular rhythm.  ?   Pulses: Normal pulses.  ?   Heart sounds: Normal heart sounds. No murmur heard. ?  No friction rub. No gallop.  ?Pulmonary:  ?   Effort: Pulmonary effort is normal. No respiratory distress.  ?   Breath sounds: Normal breath sounds.  ?Abdominal:  ?   General: Bowel sounds are normal. There is no distension.  ?   Palpations: Abdomen is soft. There is no mass.  ?   Tenderness: There is no abdominal tenderness. There is no guarding or rebound.  ?   Hernia: No hernia is present.  ?Genitourinary: ?   Comments: No cva tenderness.  ?Musculoskeletal:     ?   General: No swelling.  ?   Cervical back: Neck supple. No muscular tenderness.  ?   Right lower leg: No edema.  ?   Left lower leg: No edema.  ?Skin: ?   General: Skin is warm and dry.  ?   Findings: No rash.  ?Neurological:  ?   Mental  Status: She is alert.  ?   Comments: Alert, speech normal.   ?Psychiatric:     ?   Mood and Affect: Mood normal.  ? ? ?ED Results / Procedures / Treatments   ?Labs ?(all labs ordered are listed, but only abnormal  results are displayed) ?Results for orders placed or performed during the hospital encounter of 01/02/22  ?Resp Panel by RT-PCR (Flu A&B, Covid) Nasopharyngeal Swab  ? Specimen: Nasopharyngeal Swab; Nasopharyngeal(NP) swabs in vial transport medium  ?Result Value Ref Range  ? SARS Coronavirus 2 by RT PCR NEGATIVE NEGATIVE  ? Influenza A by PCR NEGATIVE NEGATIVE  ? Influenza B by PCR NEGATIVE NEGATIVE  ?CBC  ?Result Value Ref Range  ? WBC 7.8 4.0 - 10.5 K/uL  ? RBC 4.37 3.87 - 5.11 MIL/uL  ? Hemoglobin 13.5 12.0 - 15.0 g/dL  ? HCT 39.4 36.0 - 46.0 %  ? MCV 90.2 80.0 - 100.0 fL  ? MCH 30.9 26.0 - 34.0 pg  ? MCHC 34.3 30.0 - 36.0 g/dL  ? RDW 12.7 11.5 - 15.5 %  ? Platelets 200 150 - 400 K/uL  ? nRBC 0.0 0.0 - 0.2 %  ?Comprehensive metabolic panel  ?Result Value Ref Range  ? Sodium 141 135 - 145 mmol/L  ? Potassium 3.9 3.5 - 5.1 mmol/L  ? Chloride 108 98 - 111 mmol/L  ? CO2 25 22 - 32 mmol/L  ? Glucose, Bld 86 70 - 99 mg/dL  ? BUN 8 6 - 20 mg/dL  ? Creatinine, Ser 0.59 0.44 - 1.00 mg/dL  ? Calcium 8.8 (L) 8.9 - 10.3 mg/dL  ? Total Protein 6.4 (L) 6.5 - 8.1 g/dL  ? Albumin 4.0 3.5 - 5.0 g/dL  ? AST 12 (L) 15 - 41 U/L  ? ALT <5 0 - 44 U/L  ? Alkaline Phosphatase 57 38 - 126 U/L  ? Total Bilirubin 0.3 0.3 - 1.2 mg/dL  ? GFR, Estimated >60 >60 mL/min  ? Anion gap 8 5 - 15  ?Lipase, blood  ?Result Value Ref Range  ? Lipase 31 11 - 51 U/L  ?HIV Antibody (routine testing w rflx)  ?Result Value Ref Range  ? HIV Screen 4th Generation wRfx Non Reactive Non Reactive  ? ? ?EKG ?None ? ?Radiology ?US Abdomen Limited RUQ (LIVER/GB) ? ?Result Date: 01/02/2022 ?CLINICAL DATA:  Abdominal pain EXAM: ULTRASOUND ABDOMEN LIMITED RIGHT UPPER QUADRANT COMPARISON:  None. FINDINGS: Gallbladder: Multiple gallstones present within the  gallbladder. The gallbladder is of normal distension. The wall is slightly thickened at up to 4 mm. Cannot assess for Eulah Pont sign because of pain medication. Largest stone measures 1.4 cm. Common bile duct: Diameter: 5 mm

## 2022-01-03 ENCOUNTER — Observation Stay (HOSPITAL_COMMUNITY): Payer: Medicaid Other

## 2022-01-03 ENCOUNTER — Encounter (HOSPITAL_COMMUNITY): Payer: Self-pay | Admitting: Surgery

## 2022-01-03 ENCOUNTER — Other Ambulatory Visit: Payer: Self-pay

## 2022-01-03 ENCOUNTER — Observation Stay (HOSPITAL_BASED_OUTPATIENT_CLINIC_OR_DEPARTMENT_OTHER): Payer: Medicaid Other | Admitting: Anesthesiology

## 2022-01-03 ENCOUNTER — Observation Stay (HOSPITAL_COMMUNITY): Payer: Medicaid Other | Admitting: Anesthesiology

## 2022-01-03 ENCOUNTER — Encounter (HOSPITAL_COMMUNITY)
Admission: EM | Disposition: A | Discharge status: Home / Self Care | Payer: Self-pay | Source: Home / Self Care | Attending: Emergency Medicine

## 2022-01-03 DIAGNOSIS — K8000 Calculus of gallbladder with acute cholecystitis without obstruction: Secondary | ICD-10-CM | POA: Diagnosis not present

## 2022-01-03 HISTORY — PX: CHOLECYSTECTOMY: SHX55

## 2022-01-03 LAB — HIV ANTIBODY (ROUTINE TESTING W REFLEX): HIV Screen 4th Generation wRfx: NONREACTIVE

## 2022-01-03 SURGERY — LAPAROSCOPIC CHOLECYSTECTOMY WITH INTRAOPERATIVE CHOLANGIOGRAM
Anesthesia: General | Site: Abdomen

## 2022-01-03 MED ORDER — 0.9 % SODIUM CHLORIDE (POUR BTL) OPTIME
TOPICAL | Status: DC | PRN
  Administered 2022-01-03: 1000 mL

## 2022-01-03 MED ORDER — SUGAMMADEX SODIUM 200 MG/2ML IV SOLN
INTRAVENOUS | Status: DC | PRN
  Administered 2022-01-03: 400 mg via INTRAVENOUS

## 2022-01-03 MED ORDER — KETOROLAC TROMETHAMINE 30 MG/ML IJ SOLN
INTRAMUSCULAR | Status: DC | PRN
  Administered 2022-01-03: 30 mg via INTRAVENOUS

## 2022-01-03 MED ORDER — ROCURONIUM BROMIDE 10 MG/ML (PF) SYRINGE
PREFILLED_SYRINGE | INTRAVENOUS | Status: DC | PRN
  Administered 2022-01-03: 70 mg via INTRAVENOUS

## 2022-01-03 MED ORDER — FENTANYL CITRATE (PF) 250 MCG/5ML IJ SOLN
INTRAMUSCULAR | Status: AC
  Filled 2022-01-03: qty 5

## 2022-01-03 MED ORDER — KETOROLAC TROMETHAMINE 30 MG/ML IJ SOLN
INTRAMUSCULAR | Status: AC
  Filled 2022-01-03: qty 1

## 2022-01-03 MED ORDER — SODIUM CHLORIDE 0.9 % IV SOLN
INTRAVENOUS | Status: DC | PRN
  Administered 2022-01-03: 100 mL

## 2022-01-03 MED ORDER — SODIUM CHLORIDE 0.9 % IV SOLN
2.0000 g | INTRAVENOUS | Status: AC
  Administered 2022-01-03: 2 g via INTRAVENOUS
  Filled 2022-01-03: qty 20

## 2022-01-03 MED ORDER — BUPIVACAINE-EPINEPHRINE (PF) 0.25% -1:200000 IJ SOLN
INTRAMUSCULAR | Status: AC
  Filled 2022-01-03: qty 30

## 2022-01-03 MED ORDER — OXYCODONE HCL 5 MG PO TABS: 5.0000 mg | ORAL_TABLET | Freq: Once | ORAL | Status: DC | PRN

## 2022-01-03 MED ORDER — FENTANYL CITRATE (PF) 100 MCG/2ML IJ SOLN
INTRAMUSCULAR | Status: AC
  Filled 2022-01-03: qty 2

## 2022-01-03 MED ORDER — PROPOFOL 10 MG/ML IV BOLUS
INTRAVENOUS | Status: AC
  Filled 2022-01-03: qty 20

## 2022-01-03 MED ORDER — FENTANYL CITRATE (PF) 250 MCG/5ML IJ SOLN
INTRAMUSCULAR | Status: DC | PRN
  Administered 2022-01-03 (×5): 50 ug via INTRAVENOUS

## 2022-01-03 MED ORDER — DEXAMETHASONE SODIUM PHOSPHATE 10 MG/ML IJ SOLN
INTRAMUSCULAR | Status: DC | PRN
  Administered 2022-01-03: 10 mg via INTRAVENOUS

## 2022-01-03 MED ORDER — ACETAMINOPHEN 500 MG PO TABS
1000.0000 mg | ORAL_TABLET | ORAL | Status: DC
  Filled 2022-01-03: qty 2

## 2022-01-03 MED ORDER — SCOPOLAMINE 1 MG/3DAYS TD PT72
MEDICATED_PATCH | TRANSDERMAL | Status: AC
  Administered 2022-01-03: 1.5 mg via TRANSDERMAL
  Filled 2022-01-03: qty 1

## 2022-01-03 MED ORDER — SODIUM CHLORIDE 0.9 % IR SOLN
Status: DC | PRN
  Administered 2022-01-03: 1

## 2022-01-03 MED ORDER — BUPIVACAINE-EPINEPHRINE 0.25% -1:200000 IJ SOLN
INTRAMUSCULAR | Status: DC | PRN
  Administered 2022-01-03: 11 mL

## 2022-01-03 MED ORDER — ONDANSETRON HCL 4 MG/2ML IJ SOLN: 4.0000 mg | Freq: Once | INTRAMUSCULAR | Status: DC | PRN

## 2022-01-03 MED ORDER — LIDOCAINE 2% (20 MG/ML) 5 ML SYRINGE
INTRAMUSCULAR | Status: DC | PRN
  Administered 2022-01-03: 20 mg via INTRAVENOUS
  Administered 2022-01-03: 80 mg via INTRAVENOUS

## 2022-01-03 MED ORDER — DEXMEDETOMIDINE (PRECEDEX) IN NS 20 MCG/5ML (4 MCG/ML) IV SYRINGE
PREFILLED_SYRINGE | INTRAVENOUS | Status: AC
  Filled 2022-01-03: qty 5

## 2022-01-03 MED ORDER — CHLORHEXIDINE GLUCONATE 0.12 % MT SOLN
15.0000 mL | Freq: Once | OROMUCOSAL | Status: AC
  Administered 2022-01-03: 15 mL via OROMUCOSAL

## 2022-01-03 MED ORDER — GLYCOPYRROLATE PF 0.2 MG/ML IJ SOSY
PREFILLED_SYRINGE | INTRAMUSCULAR | Status: DC | PRN
  Administered 2022-01-03: .2 mg via INTRAVENOUS

## 2022-01-03 MED ORDER — OXYCODONE HCL 5 MG/5ML PO SOLN: 5.0000 mg | Freq: Once | ORAL | Status: DC | PRN

## 2022-01-03 MED ORDER — MIDAZOLAM HCL 2 MG/2ML IJ SOLN
INTRAMUSCULAR | Status: AC
  Filled 2022-01-03: qty 2

## 2022-01-03 MED ORDER — ONDANSETRON HCL 4 MG/2ML IJ SOLN
INTRAMUSCULAR | Status: DC | PRN
Start: 2022-01-03 — End: 2022-01-03
  Administered 2022-01-03: 4 mg via INTRAVENOUS

## 2022-01-03 MED ORDER — PHENYLEPHRINE 40 MCG/ML (10ML) SYRINGE FOR IV PUSH (FOR BLOOD PRESSURE SUPPORT)
PREFILLED_SYRINGE | INTRAVENOUS | Status: DC | PRN
  Administered 2022-01-03 (×2): 40 ug via INTRAVENOUS

## 2022-01-03 MED ORDER — PROPOFOL 10 MG/ML IV BOLUS
INTRAVENOUS | Status: DC | PRN
  Administered 2022-01-03: 200 mg via INTRAVENOUS

## 2022-01-03 MED ORDER — DEXAMETHASONE SODIUM PHOSPHATE 10 MG/ML IJ SOLN
INTRAMUSCULAR | Status: AC
  Filled 2022-01-03: qty 1

## 2022-01-03 MED ORDER — AMISULPRIDE (ANTIEMETIC) 5 MG/2ML IV SOLN: 10.0000 mg | Freq: Once | INTRAVENOUS | Status: DC | PRN

## 2022-01-03 MED ORDER — LACTATED RINGERS IV SOLN: INTRAVENOUS | Status: DC

## 2022-01-03 MED ORDER — DEXMEDETOMIDINE (PRECEDEX) IN NS 20 MCG/5ML (4 MCG/ML) IV SYRINGE
PREFILLED_SYRINGE | INTRAVENOUS | Status: DC | PRN
  Administered 2022-01-03: 8 ug via INTRAVENOUS

## 2022-01-03 MED ORDER — FENTANYL CITRATE (PF) 100 MCG/2ML IJ SOLN
25.0000 ug | INTRAMUSCULAR | Status: DC | PRN
  Administered 2022-01-03 (×2): 25 ug via INTRAVENOUS

## 2022-01-03 MED ORDER — ONDANSETRON HCL 4 MG/2ML IJ SOLN
INTRAMUSCULAR | Status: AC
  Filled 2022-01-03: qty 2

## 2022-01-03 MED ORDER — SCOPOLAMINE 1 MG/3DAYS TD PT72: 1.0000 | MEDICATED_PATCH | TRANSDERMAL | Status: DC

## 2022-01-03 MED ORDER — MIDAZOLAM HCL 5 MG/5ML IJ SOLN
INTRAMUSCULAR | Status: DC | PRN
  Administered 2022-01-03: 2 mg via INTRAVENOUS

## 2022-01-03 MED ORDER — ORAL CARE MOUTH RINSE: 15.0000 mL | Freq: Once | OROMUCOSAL | Status: AC

## 2022-01-03 SURGICAL SUPPLY — 44 items
APPLIER CLIP ROT 10 11.4 M/L (STAPLE) ×2
BAG COUNTER SPONGE SURGICOUNT (BAG) ×2 IMPLANT
BENZOIN TINCTURE PRP APPL 2/3 (GAUZE/BANDAGES/DRESSINGS) ×2 IMPLANT
BLADE CLIPPER SURG (BLADE) IMPLANT
CANISTER SUCT 3000ML PPV (MISCELLANEOUS) ×2 IMPLANT
CHLORAPREP W/TINT 26 (MISCELLANEOUS) ×2 IMPLANT
CLIP APPLIE ROT 10 11.4 M/L (STAPLE) ×1 IMPLANT
COVER MAYO STAND STRL (DRAPES) ×1 IMPLANT
COVER SURGICAL LIGHT HANDLE (MISCELLANEOUS) ×2 IMPLANT
DERMABOND ADVANCED (GAUZE/BANDAGES/DRESSINGS) ×1
DERMABOND ADVANCED .7 DNX12 (GAUZE/BANDAGES/DRESSINGS) IMPLANT
DRAPE C-ARM 42X120 X-RAY (DRAPES) ×2 IMPLANT
DRSG TEGADERM 2-3/8X2-3/4 SM (GAUZE/BANDAGES/DRESSINGS) ×6 IMPLANT
DRSG TEGADERM 4X4.75 (GAUZE/BANDAGES/DRESSINGS) ×2 IMPLANT
ELECT REM PT RETURN 9FT ADLT (ELECTROSURGICAL) ×2
ELECTRODE REM PT RTRN 9FT ADLT (ELECTROSURGICAL) ×1 IMPLANT
GAUZE SPONGE 2X2 8PLY STRL LF (GAUZE/BANDAGES/DRESSINGS) ×1 IMPLANT
GLOVE SURG ENC MOIS LTX SZ7 (GLOVE) ×2 IMPLANT
GLOVE SURG UNDER POLY LF SZ7.5 (GLOVE) ×2 IMPLANT
GOWN STRL REUS W/ TWL LRG LVL3 (GOWN DISPOSABLE) ×3 IMPLANT
GOWN STRL REUS W/TWL LRG LVL3 (GOWN DISPOSABLE) ×3
KIT BASIN OR (CUSTOM PROCEDURE TRAY) ×2 IMPLANT
KIT TURNOVER KIT B (KITS) ×2 IMPLANT
NS IRRIG 1000ML POUR BTL (IV SOLUTION) ×2 IMPLANT
PAD ARMBOARD 7.5X6 YLW CONV (MISCELLANEOUS) ×2 IMPLANT
POUCH RETRIEVAL ECOSAC 10 (ENDOMECHANICALS) IMPLANT
POUCH RETRIEVAL ECOSAC 10MM (ENDOMECHANICALS) ×1
POUCH SPECIMEN RETRIEVAL 10MM (ENDOMECHANICALS) IMPLANT
SCISSORS LAP 5X35 DISP (ENDOMECHANICALS) ×2 IMPLANT
SET CHOLANGIOGRAPH 5 50 .035 (SET/KITS/TRAYS/PACK) ×2 IMPLANT
SET IRRIG TUBING LAPAROSCOPIC (IRRIGATION / IRRIGATOR) ×2 IMPLANT
SET TUBE SMOKE EVAC HIGH FLOW (TUBING) ×2 IMPLANT
SLEEVE ENDOPATH XCEL 5M (ENDOMECHANICALS) ×2 IMPLANT
SPECIMEN JAR SMALL (MISCELLANEOUS) ×2 IMPLANT
SPONGE GAUZE 2X2 STER 10/PKG (GAUZE/BANDAGES/DRESSINGS) ×1
STRIP CLOSURE SKIN 1/2X4 (GAUZE/BANDAGES/DRESSINGS) ×2 IMPLANT
SUT MNCRL AB 4-0 PS2 18 (SUTURE) ×2 IMPLANT
TOWEL GREEN STERILE (TOWEL DISPOSABLE) ×2 IMPLANT
TOWEL GREEN STERILE FF (TOWEL DISPOSABLE) ×2 IMPLANT
TRAY LAPAROSCOPIC MC (CUSTOM PROCEDURE TRAY) ×2 IMPLANT
TROCAR XCEL BLUNT TIP 100MML (ENDOMECHANICALS) ×2 IMPLANT
TROCAR XCEL NON-BLD 11X100MML (ENDOMECHANICALS) ×2 IMPLANT
TROCAR XCEL NON-BLD 5MMX100MML (ENDOMECHANICALS) ×2 IMPLANT
WATER STERILE IRR 1000ML POUR (IV SOLUTION) ×1 IMPLANT

## 2022-01-03 NOTE — Anesthesia Procedure Notes (Signed)
Procedure Name: Intubation ?Date/Time: 01/03/2022 10:11 AM ?Performed by: Colin Benton, CRNA ?Pre-anesthesia Checklist: Patient identified, Emergency Drugs available, Suction available and Patient being monitored ?Patient Re-evaluated:Patient Re-evaluated prior to induction ?Oxygen Delivery Method: Circle system utilized ?Preoxygenation: Pre-oxygenation with 100% oxygen ?Induction Type: IV induction ?Ventilation: Mask ventilation without difficulty ?Laryngoscope Size: Mac and 3 ?Grade View: Grade I ?Tube type: Oral ?Tube size: 7.0 mm ?Number of attempts: 1 ?Airway Equipment and Method: Stylet ?Placement Confirmation: ETT inserted through vocal cords under direct vision, positive ETCO2 and breath sounds checked- equal and bilateral ?Secured at: 22 cm ?Tube secured with: Tape ?Dental Injury: Teeth and Oropharynx as per pre-operative assessment  ?Comments: Intubation performed by Bing Neighbors paramedic student. Anesthesiologist and CRNA present. ? ? ? ? ?

## 2022-01-03 NOTE — Op Note (Signed)
Laparoscopic Cholecystectomy with IOC Procedure Note ? ?Indications: This patient presents with symptomatic gallbladder disease and will undergo laparoscopic cholecystectomy. ? ?Pre-operative Diagnosis: Calculus of gallbladder with acute cholecystitis, without mention of obstruction ? ?Post-operative Diagnosis: Same ? ?Surgeon: Maia Petties  ? ?Assistants: Richard Miu, PA-C ? ?Anesthesia: General endotracheal anesthesia ? ?ASA Class: 2 ? ?Procedure Details  ?The patient was seen again in the Holding Room. The risks, benefits, complications, treatment options, and expected outcomes were discussed with the patient. The possibilities of reaction to medication, pulmonary aspiration, perforation of viscus, bleeding, recurrent infection, finding a normal gallbladder, the need for additional procedures, failure to diagnose a condition, the possible need to convert to an open procedure, and creating a complication requiring transfusion or operation were discussed with the patient. The likelihood of improving the patient's symptoms with return to their baseline status is good.  The patient and/or family concurred with the proposed plan, giving informed consent. The site of surgery properly noted. The patient was taken to Operating Room, identified as Anita [name] and the procedure verified as Laparoscopic Cholecystectomy with Intraoperative Cholangiogram. A Time Out was held and the above information confirmed. ? ?Prior to the induction of general anesthesia, antibiotic prophylaxis was administered. General endotracheal anesthesia was then administered and tolerated well. After the induction, the abdomen was prepped with Chloraprep and draped in the sterile fashion. The patient was positioned in the supine position. ? ?Local anesthetic agent was injected into the skin below the umbilicus and an incision made. We dissected down to the abdominal fascia with blunt dissection.  The fascia was incised vertically and  we entered the peritoneal cavity bluntly.  A pursestring suture of 0-Vicryl was placed around the fascial opening.  The Hasson cannula was inserted and secured with the stay suture.  Pneumoperitoneum was then created with CO2 and tolerated well without any adverse changes in the patient's vital signs. An 11-mm port was placed in the subxiphoid position.  Two 5-mm ports were placed in the right upper quadrant. All skin incisions were infiltrated with a local anesthetic agent before making the incision and placing the trocars.  ? ?We positioned the patient in reverse Trendelenburg, tilted slightly to the patient's left.  The gallbladder was identified, the fundus grasped and retracted cephalad. The gallbladder is quite distended and edematous.  Adhesions were lysed bluntly and with the electrocautery where indicated, taking care not to injure any adjacent organs or viscus. The infundibulum was grasped and retracted laterally, exposing the peritoneum overlying the triangle of Calot. This was then divided and exposed in a blunt fashion. A critical view of the cystic duct and cystic artery was obtained.  The cystic duct was clearly identified and bluntly dissected circumferentially. The cystic duct was ligated with a clip distally.   An incision was made in the cystic duct and the [facility] cholangiogram catheter introduced. The catheter was secured using a clip. A cholangiogram was then obtained which showed good visualization of the distal and proximal biliary tree with no sign of obstruction.  Contrast flowed easily into the duodenum. However, several filling defects were identified floating in the proximal common bile duct.  We repeated the cholangiogram in an attempt to flush these defects, but they did not flush through the ampulla.  The catheter was then removed.  ? ?The cystic duct was then ligated with clips and divided. The cystic artery was identified, dissected free, ligated with clips and divided as well.  ? ?The  gallbladder was dissected from  the liver bed in retrograde fashion with the electrocautery. The gallbladder was removed and placed in an Eco sac. The liver bed was irrigated and inspected. Hemostasis was achieved with the electrocautery. Copious irrigation was utilized and was repeatedly aspirated until clear.  The gallbladder and Eco sac were then removed through the umbilical port site.  The pursestring suture was used to close the umbilical fascia.   ? ?We again inspected the right upper quadrant for hemostasis.  Pneumoperitoneum was released as we removed the trocars.  4-0 Monocryl was used to close the skin.   Benzoin, steri-strips, and clean dressings were applied. The patient was then extubated and brought to the recovery room in stable condition. Instrument, sponge, and needle counts were correct at closure and at the conclusion of the case.  ? ?Findings: ?Cholecystitis with Cholelithiasis; choledocholithiasis ? ?Estimated Blood Loss: less than 50 mL ?        ?Drains: none ?        ?Specimens: Gallbladder     ?      ?Complications: None; patient tolerated the procedure well. ?        ?Disposition: PACU - hemodynamically stable. ?        ?Condition: stable ? ?Imogene Burn. Chenae Brager, MD, FACS ?[facility]  ?General Surgery ? ? ?01/03/2022 ?11:13 AM ? ? ? ?

## 2022-01-03 NOTE — Progress Notes (Signed)
Patient back on the unit from her procedure. Patient c/o 8/10 abd pain. Pain regimen administrated per [facility] ?

## 2022-01-03 NOTE — Discharge Instructions (Signed)
[facility] ? ?Please arrive at least 30 min before your appointment to complete your check in paperwork.  If you are unable to arrive 30 min prior to your appointment time we may have to cancel or reschedule you. ?LAPAROSCOPIC SURGERY: POST OP INSTRUCTIONS ?Always review your discharge instruction sheet given to you by the facility where your surgery was performed. ?IF YOU HAVE DISABILITY OR FAMILY LEAVE FORMS, YOU MUST BRING THEM TO THE OFFICE FOR PROCESSING.   ?DO NOT GIVE THEM TO YOUR DOCTOR. ? ?PAIN CONTROL ? ?First take acetaminophen (Tylenol) AND/or ibuprofen (Advil) to control your pain after surgery.  Follow directions on package.  Taking acetaminophen (Tylenol) and/or ibuprofen (Advil) regularly after surgery will help to control your pain and lower the amount of prescription pain medication you may need.  You should not take more than 4,000 mg (4 grams) of acetaminophen (Tylenol) in 24 hours.  You should not take ibuprofen (Advil), aleve, motrin, naprosyn or other NSAIDS if you have a history of stomach ulcers or chronic kidney disease.  ?A prescription for pain medication may be given to you upon discharge.  Take your pain medication as prescribed, if you still have uncontrolled pain after taking acetaminophen (Tylenol) or ibuprofen (Advil). ?Use ice packs to help control pain. ?If you need a refill on your pain medication, please contact your pharmacy.  They will contact our office to request authorization. Prescriptions will not be filled after 5pm or on week-ends. ? ?HOME MEDICATIONS ?Take your usually prescribed medications unless otherwise directed. ? ?DIET ?You should follow a light diet the first few days after arrival home.  Be sure to include lots of fluids daily. Avoid fatty, fried foods.  ? ?CONSTIPATION ?It is common to experience some constipation after surgery and if you are taking pain medication.  Increasing fluid intake and taking a stool softener (such as Colace)  will usually help or prevent this problem from occurring.  A mild laxative (Milk of Magnesia or Miralax) should be taken according to package instructions if there are no bowel movements after 48 hours. ? ?WOUND/INCISION CARE ?Most patients will experience some swelling and bruising in the area of the incisions.  Ice packs will help.  Swelling and bruising can take several days to resolve.  ?Unless discharge instructions indicate otherwise, follow guidelines below  ?STERI-STRIPS - you may remove your outer bandages 48 hours after surgery, and you may shower at that time.  You have steri-strips (small skin tapes) in place directly over the incision.  These strips should be left on the skin for 7-10 days.   ?DERMABOND/SKIN GLUE - you may shower in 24 hours.  The glue will flake off over the next 2-3 weeks. ?Any sutures or staples will be removed at the office during your follow-up visit. ? ?ACTIVITIES ?You may resume regular (light) daily activities beginning the next day--such as daily self-care, walking, climbing stairs--gradually increasing activities as tolerated.  You may have sexual intercourse when it is comfortable.  Refrain from any heavy lifting or straining until approved by your doctor. ?You may drive when you are no longer taking prescription pain medication, you can comfortably wear a seatbelt, and you can safely maneuver your car and apply brakes. ? ?FOLLOW-UP ?You should see your doctor in the office for a follow-up appointment approximately 2-3 weeks after your surgery.  You should have been given your post-op/follow-up appointment when your surgery was scheduled.  If you did not receive a post-op/follow-up appointment, make sure   that you call for this appointment within a day or two after you arrive home to insure a convenient appointment time. ? ? ?WHEN TO CALL YOUR DOCTOR: ?Fever over 101.0 ?Inability to urinate ?Continued bleeding from incision. ?Increased pain, redness, or drainage from the  incision. ?Increasing abdominal pain ? ?The clinic staff is available to answer your questions during regular business hours.  Please don?t hesitate to call and ask to speak to one of the nurses for clinical concerns.  If you have a medical emergency, go to the nearest emergency room or call 911.  A surgeon from [facility] is always on call at the hospital. ?[address], [address], [place], [place]  [address] ? [address] [address], [place], [place]   [address] ?[phone] ? [phone] ? FAX [phone] ? ? ? ? ?Managing Your Pain After Surgery Without Opioids ? ? ? ?Thank you for participating in our program to help patients manage their pain after surgery without opioids. This is part of our effort to provide you with the best care possible, without exposing you or your family to the risk that opioids pose. ? ?What pain can I expect after surgery? ?You can expect to have some pain after surgery. This is normal. The pain is typically worse the day after surgery, and quickly begins to get better. ?Many studies have found that many patients are able to manage their pain after surgery with Over-the-Counter (OTC) medications such as Tylenol and Motrin. If you have a condition that does not allow you to take Tylenol or Motrin, notify your surgical team. ? ?How will I manage my pain? ?The best strategy for controlling your pain after surgery is around the clock pain control with Tylenol (acetaminophen) and Motrin (ibuprofen or Advil). Alternating these medications with each other allows you to maximize your pain control. In addition to Tylenol and Motrin, you can use heating pads or ice packs on your incisions to help reduce your pain. ? ?How will I alternate your regular strength over-the-counter pain medication? ?You will take a dose of pain medication every three hours. ?Start by taking 650 mg of Tylenol (2 pills of 325 mg) ?3 hours later take 600 mg of Motrin (3 pills of 200 mg) ?3 hours after  taking the Motrin take 650 mg of Tylenol ?3 hours after that take 600 mg of Motrin. ? ? ?- 1 - ? ?See example - if your first dose of Tylenol is at 12:00 PM ? ? ?12:00 PM Tylenol 650 mg (2 pills of 325 mg)  ?3:00 PM Motrin 600 mg (3 pills of 200 mg)  ?6:00 PM Tylenol 650 mg (2 pills of 325 mg)  ?9:00 PM Motrin 600 mg (3 pills of 200 mg)  ?Continue alternating every 3 hours  ? ?We recommend that you follow this schedule around-the-clock for at least 3 days after surgery, or until you feel that it is no longer needed. Use the table on the last page of this handout to keep track of the medications you are taking. ?Important: ?Do not take more than 3000mg of Tylenol or 3200mg of Motrin in a 24-hour period. ?Do not take ibuprofen/Motrin if you have a history of bleeding stomach ulcers, severe kidney disease, &/or actively taking a blood thinner ? ?What if I still have pain? ?If you have pain that is not controlled with the over-the-counter pain medications (Tylenol and Motrin or Advil) you might have what we call ?breakthrough? pain. You will receive a prescription   for a small amount of an opioid pain medication such as Oxycodone, Tramadol, or Tylenol with Codeine. Use these opioid pills in the first 24 hours after surgery if you have breakthrough pain. Do not take more than 1 pill every 4-6 hours. ? ?If you still have uncontrolled pain after using all opioid pills, don't hesitate to call our staff using the number provided. We will help make sure you are managing your pain in the best way possible, and if necessary, we can provide a prescription for additional pain medication. ? ? ?Day 1   ? ?Time  ?Name of Medication Number of pills taken  ?Amount of Acetaminophen  ?Pain Level  ? ?Comments  ?AM PM       ?AM PM       ?AM PM       ?AM PM       ?AM PM       ?AM PM       ?AM PM       ?AM PM       ?Total Daily amount of Acetaminophen ?Do not take more than  3,000 mg per day    ? ? ?Day 2   ? ?Time  ?Name of Medication  Number of pills ?taken  ?Amount of Acetaminophen  ?Pain Level  ? ?Comments  ?AM PM       ?AM PM       ?AM PM       ?AM PM       ?AM PM       ?AM PM       ?AM PM       ?AM PM       ?Total Daily amount of Acetaminop

## 2022-01-03 NOTE — Anesthesia Preprocedure Evaluation (Signed)
Anesthesia Evaluation  ?Patient identified by MRN, date of birth, ID band ?Patient awake ? ? ? ?Reviewed: ?Allergy & Precautions, NPO status , Patient's Chart, lab work & pertinent test results ? ?Airway ?Mallampati: II ? ?TM Distance: >3 FB ?Neck ROM: Full ? ? ? Dental ?no notable dental hx. ? ?  ?Pulmonary ?asthma , Current Smoker and Patient abstained from smoking.,  ?  ?Pulmonary exam normal ?breath sounds clear to auscultation ? ? ? ? ? ? Cardiovascular ?Exercise Tolerance: Good ?negative cardio ROS ?Normal cardiovascular exam ?Rhythm:Regular Rate:Normal ? ? ?  ?Neuro/Psych ? Headaches, PSYCHIATRIC DISORDERS Anxiety Bipolar Disorder   ? GI/Hepatic ?negative GI ROS, Neg liver ROS,   ?Endo/Other  ?Morbid obesity (obesity) ? Renal/GU ?negative Renal ROS  ?negative genitourinary ?  ?Musculoskeletal ?negative musculoskeletal ROS ?(+)  ? Abdominal ?  ?Peds ?negative pediatric ROS ?(+)  Hematology ?negative hematology ROS ?(+)   ?Anesthesia Other Findings ? ? Reproductive/Obstetrics ?negative OB ROS ? ?  ? ? ? ? ? ? ? ? ? ? ? ? ? ?  ?  ? ? ? ? ? ? ? ?Anesthesia Physical ?Anesthesia Plan ? ?ASA: 3 ? ?Anesthesia Plan: General  ? ?Post-op Pain Management: Tylenol PO (pre-op)* and Minimal or no pain anticipated  ? ?Induction: Intravenous ? ?PONV Risk Score and Plan: 2 and Treatment may vary due to age or medical condition, Scopolamine patch - Pre-op, Midazolam, Dexamethasone and Ondansetron ? ?Airway Management Planned: Oral ETT ? ?Additional Equipment: None ? ?Intra-op Plan:  ? ?Post-operative Plan: Extubation in OR ? ?Informed Consent: I have reviewed the patients History and Physical, chart, labs and discussed the procedure including the risks, benefits and alternatives for the proposed anesthesia with the patient or authorized representative who has indicated his/her understanding and acceptance.  ? ? ? ?Dental advisory given ? ?Plan Discussed with: CRNA, Anesthesiologist and  Surgeon ? ?Anesthesia Plan Comments:   ? ? ? ? ? ? ?Anesthesia Quick Evaluation ? ?

## 2022-01-03 NOTE — Transfer of Care (Signed)
Immediate Anesthesia Transfer of Care Note ? ?Patient: Anita [name] ? ?Procedure(s) Performed: LAPAROSCOPIC CHOLECYSTECTOMY WITH INTRAOPERATIVE CHOLANGIOGRAM (Abdomen) ? ?Patient Location: PACU ? ?Anesthesia Type:General ? ?Level of Consciousness: awake, alert , oriented and patient cooperative ? ?Airway & Oxygen Therapy: Patient Spontanous Breathing and Patient connected to nasal cannula oxygen ? ?Post-op Assessment: Report given to RN and Post -op Vital signs reviewed and stable ? ?Post vital signs: Reviewed and stable ? ?Last Vitals:  ?Vitals Value Taken Time  ?BP 104/54 01/03/22 1130  ?Temp 36.4 ?C 01/03/22 1130  ?Pulse    ?Resp 16 01/03/22 1133  ?SpO2 100 % 01/03/22 1130  ?Vitals shown include unvalidated device data. ? ?Last Pain:  ?Vitals:  ? 01/03/22 0924  ?TempSrc:   ?PainSc: 5   ?   ? ?Patients Stated Pain Goal: 3 (01/03/22 7510) ? ?Complications: No notable events documented. ?

## 2022-01-03 NOTE — Progress Notes (Signed)
? ?  Subjective/Chief Complaint: ?Patient still with RUQ pain, mild nausea ? ? ?Objective: ?Vital signs in last 24 hours: ?Temp:  [98.2 ?F (36.8 ?C)-98.6 ?F (37 ?C)] 98.2 ?F (36.8 ?C) (03/16 0420) ?Pulse Rate:  [62-81] 62 (03/16 0420) ?Resp:  [12-18] 18 (03/16 0420) ?BP: (80-107)/(51-61) 91/55 (03/16 0600) ?SpO2:  [97 %-100 %] 97 % (03/16 0420) ?Weight:  [99.8 kg-101.7 kg] 101.7 kg (03/15 2140) ?  ? ?Intake/Output from previous day: ?03/15 0701 - 03/16 0700 ?In: 476.7 [I.V.:476.7] ?Out: -  ?Intake/Output this shift: ?No intake/output data recorded. ? ?General appearance: alert, cooperative, and no distress ?Abd - soft, RUQ tenderness ? ?Lab Results:  ?Recent Labs  ?  01/01/22 ?2306 01/02/22 ?2011  ?WBC 12.5* 7.8  ?HGB 13.0 13.5  ?HCT 37.6 39.4  ?PLT 239 200  ? ?BMET ?Recent Labs  ?  01/01/22 ?2306 01/02/22 ?2011  ?NA 140 141  ?K 3.3* 3.9  ?CL 106 108  ?CO2 25 25  ?GLUCOSE 87 86  ?BUN 8 8  ?CREATININE 0.63 0.59  ?CALCIUM 9.1 8.8*  ? ?PT/INR ?No results for input(s): LABPROT, INR in the last 72 hours. ?ABG ?No results for input(s): PHART, HCO3 in the last 72 hours. ? ?Invalid input(s): PCO2, PO2 ? ?Studies/Results: ?US Abdomen Limited RUQ (LIVER/GB) ? ?Result Date: 01/02/2022 ?CLINICAL DATA:  Abdominal pain EXAM: ULTRASOUND ABDOMEN LIMITED RIGHT UPPER QUADRANT COMPARISON:  None. FINDINGS: Gallbladder: Multiple gallstones present within the gallbladder. The gallbladder is of normal distension. The wall is slightly thickened at up to 4 mm. Cannot assess for Eulah Pont sign because of pain medication. Largest stone measures 1.4 cm. Common bile duct: Diameter: 5 mm, upper limits of normal. Liver: Normal echogenicity. No focal lesion. Portal vein is patent on color Doppler imaging with normal direction of blood flow towards the liver. Other: None. IMPRESSION: Chololithiasis. The largest stone measures 1.4 cm. Mild gallbladder wall thickening at 4 mm. Cannot assess for Eulah Pont sign due to pain medication. Cholecystitis is not  excluded. Consider nuclear medicine hepatobiliary scan if further information desired. Electronically Signed   By: Paulina Fusi M.D.   On: 01/02/2022 19:27   ? ?Anti-infectives: ?Anti-infectives (From admission, onward)  ? ? Start     Dose/Rate Route Frequency Ordered Stop  ? 01/03/22 0815  cefTRIAXone (ROCEPHIN) 2 g in sodium chloride 0.9 % 100 mL IVPB       ? 2 g ?200 mL/hr over 30 Minutes Intravenous On call to O.R. 01/03/22 0719 01/04/22 0559  ? ?  ? ? ?Assessment/Plan: ? ?Probable acute cholecystitis with cholelithiasis in the neck of the gallbladder ? ?Plan laparoscopic cholecystectomy with intraoperative cholangiogram today.  The surgical procedure has been discussed with the patient.  Potential risks, benefits, alternative treatments, and expected outcomes have been explained.  All of the patient's questions at this time have been answered.  The likelihood of reaching the patient's treatment goal is good.  The patient understand the proposed surgical procedure and wishes to proceed. ? ?Possible discharge later today. ? ? LOS: 0 days  ? ? ?Wilmon Arms Artin Mceuen ?01/03/2022 ? ?

## 2022-01-03 NOTE — Anesthesia Postprocedure Evaluation (Signed)
Anesthesia Post Note ? ?Patient: Anita [name] ? ?Procedure(s) Performed: LAPAROSCOPIC CHOLECYSTECTOMY WITH INTRAOPERATIVE CHOLANGIOGRAM (Abdomen) ? ?  ? ?Patient location during evaluation: PACU ?Anesthesia Type: General ?Level of consciousness: awake and alert ?Pain management: pain level controlled ?Vital Signs Assessment: post-procedure vital signs reviewed and stable ?Respiratory status: spontaneous breathing, nonlabored ventilation and respiratory function stable ?Cardiovascular status: blood pressure returned to baseline and stable ?Postop Assessment: no apparent nausea or vomiting ?Anesthetic complications: no ? ? ?No notable events documented. ? ?Last Vitals:  ?Vitals:  ? 01/03/22 1130 01/03/22 1145  ?BP: (!) 104/54 98/67  ?Pulse:    ?Resp: 15 18  ?Temp: 36.4 ?C   ?SpO2: 100% 98%  ?  ?Last Pain:  ?Vitals:  ? 01/03/22 1130  ?TempSrc:   ?PainSc: Asleep  ? ? ?  ?  ?  ?  ?  ?  ? ?Mellody Dance ? ? ? ? ?

## 2022-01-04 ENCOUNTER — Other Ambulatory Visit (HOSPITAL_BASED_OUTPATIENT_CLINIC_OR_DEPARTMENT_OTHER): Payer: Self-pay

## 2022-01-04 ENCOUNTER — Encounter (HOSPITAL_COMMUNITY): Payer: Self-pay | Admitting: Surgery

## 2022-01-04 LAB — CBC
HCT: 32.8 % — ABNORMAL LOW (ref 36.0–46.0)
Hemoglobin: 11.1 g/dL — ABNORMAL LOW (ref 12.0–15.0)
MCH: 30.6 pg (ref 26.0–34.0)
MCHC: 33.8 g/dL (ref 30.0–36.0)
MCV: 90.4 fL (ref 80.0–100.0)
Platelets: 185 10*3/uL (ref 150–400)
RBC: 3.63 MIL/uL — ABNORMAL LOW (ref 3.87–5.11)
RDW: 12.4 % (ref 11.5–15.5)
WBC: 10.8 10*3/uL — ABNORMAL HIGH (ref 4.0–10.5)
nRBC: 0 % (ref 0.0–0.2)

## 2022-01-04 LAB — COMPREHENSIVE METABOLIC PANEL
ALT: 30 U/L (ref 0–44)
AST: 41 U/L (ref 15–41)
Albumin: 2.9 g/dL — ABNORMAL LOW (ref 3.5–5.0)
Alkaline Phosphatase: 54 U/L (ref 38–126)
Anion gap: 8 (ref 5–15)
BUN: 5 mg/dL — ABNORMAL LOW (ref 6–20)
CO2: 21 mmol/L — ABNORMAL LOW (ref 22–32)
Calcium: 8.3 mg/dL — ABNORMAL LOW (ref 8.9–10.3)
Chloride: 109 mmol/L (ref 98–111)
Creatinine, Ser: 0.64 mg/dL (ref 0.44–1.00)
GFR, Estimated: >60 mL/min (ref >60)
Glucose, Bld: 86 mg/dL (ref 70–99)
Potassium: 3.6 mmol/L (ref 3.5–5.1)
Sodium: 138 mmol/L (ref 135–145)
Total Bilirubin: 0.7 mg/dL (ref 0.3–1.2)
Total Protein: 5.1 g/dL — ABNORMAL LOW (ref 6.5–8.1)

## 2022-01-04 LAB — SURGICAL PATHOLOGY

## 2022-01-04 MED ORDER — POLYETHYLENE GLYCOL 3350 17 G PO PACK
17.0000 g | PACK | Freq: Two times a day (BID) | ORAL | Status: DC
  Administered 2022-01-04: 17 g via ORAL
  Filled 2022-01-04: qty 1

## 2022-01-04 MED ORDER — HYDROMORPHONE HCL 1 MG/ML IJ SOLN: 0.5000 mg | INTRAMUSCULAR | Status: DC | PRN

## 2022-01-04 MED ORDER — ACETAMINOPHEN 500 MG PO TABS
1000.0000 mg | ORAL_TABLET | Freq: Four times a day (QID) | ORAL | 0 refills | Status: DC | PRN
Start: 2022-01-04 — End: 2022-02-24

## 2022-01-04 MED ORDER — POLYETHYLENE GLYCOL 3350 17 G PO PACK: 17.0000 g | PACK | Freq: Two times a day (BID) | ORAL | 0 refills | Status: DC

## 2022-01-04 MED ORDER — OXYCODONE HCL 5 MG PO TABS
5.0000 mg | ORAL_TABLET | Freq: Four times a day (QID) | ORAL | 0 refills | Status: DC | PRN
Start: 2022-01-04 — End: 2022-02-24
  Filled 2022-01-04: qty 15, 4d supply, fill #0

## 2022-01-04 MED ORDER — OXYCODONE HCL 5 MG PO TABS
5.0000 mg | ORAL_TABLET | ORAL | Status: DC | PRN
  Administered 2022-01-04: 10 mg via ORAL
  Filled 2022-01-04: qty 2

## 2022-01-04 MED ORDER — METHOCARBAMOL 500 MG PO TABS: 500.0000 mg | ORAL_TABLET | Freq: Four times a day (QID) | ORAL | Status: DC | PRN

## 2022-01-04 MED ORDER — ONDANSETRON 4 MG PO TBDP
4.0000 mg | ORAL_TABLET | Freq: Four times a day (QID) | ORAL | 0 refills | Status: DC | PRN
  Filled 2022-01-04: qty 15, 4d supply, fill #0

## 2022-01-04 MED ORDER — DOCUSATE SODIUM 100 MG PO CAPS: 100.0000 mg | ORAL_CAPSULE | Freq: Two times a day (BID) | ORAL | 0 refills | Status: DC

## 2022-01-04 NOTE — TOC Transition Note (Signed)
Transition of Care (TOC) - CM/SW Discharge Note ? ? ?Patient Details  ?Name: Anita [name] ?MRN: [id number] ?Date of Birth: 02/07/2001 ? ?Transition of Care (TOC) CM/SW Contact:  ?Tom-Johnson, Renea Ee, RN ?Phone Number: ?01/04/2022, 12:11 PM ? ? ?Clinical Narrative:    ? ?Patient is scheduled for discharge today. From home with aunt Arbie Cookey. No recommendation or needs noted. Arbie Cookey to transport at discharge. No further TOC needs noted. ? ?  ?  ? ? ?Patient Goals and CMS Choice ?  ?  ?  ? ?Discharge Placement ?  ?           ?  ?  ?  ?  ? ?Discharge Plan and Services ?  ?  ?           ?  ?  ?  ?  ?  ?  ?  ?  ?  ?  ? ?Social Determinants of Health (SDOH) Interventions ?  ? ? ?Readmission Risk Interventions ?No flowsheet data found. ? ? ? ? ?

## 2022-01-04 NOTE — Discharge Summary (Signed)
?[facility] ?Discharge Summary  ? ?Patient ID: ?Anita [name] ?MRN: [id number] ?DOB/AGE: July 12, 2001 21 y.o. ? ?Admit date: 01/02/2022 ?Discharge date: 01/04/2022 ? ? ?Discharge Diagnosis ?Calculus of gallbladder with acute cholecystitis, without mention of obstruction ? ?Consultants ?None ? ?Imaging: ?DG Cholangiogram Operative ? ?Result Date: 01/03/2022 ?CLINICAL DATA:  21 year old female with history of cholelithiasis. EXAM: INTRAOPERATIVE CHOLANGIOGRAM TECHNIQUE: Cholangiographic images from the C-arm fluoroscopic device were submitted for interpretation post-operatively. Please see the procedural report for the amount of contrast and the fluoroscopy time utilized. COMPARISON:  None. FINDINGS: Intraoperative antegrade injection via the cystic duct which opacifies the common bile duct and central portions of the intrahepatic biliary tree. Contrast is visualized flowing freely into the duodenum. There are few rounded mobile filling defects, favored represent gas bubbles. No significant intra or extrahepatic biliary ductal dilation. No apparent anomalous anatomical configuration of the biliary tree. IMPRESSION: No evidence of choledocholithiasis. Electronically Signed   By: Ruthann Cancer M.D.   On: 01/03/2022 11:48  ? ?US Abdomen Limited RUQ (LIVER/GB) ? ?Result Date: 01/02/2022 ?CLINICAL DATA:  Abdominal pain EXAM: ULTRASOUND ABDOMEN LIMITED RIGHT UPPER QUADRANT COMPARISON:  None. FINDINGS: Gallbladder: Multiple gallstones present within the gallbladder. The gallbladder is of normal distension. The wall is slightly thickened at up to 4 mm. Cannot assess for Percell Miller sign because of pain medication. Largest stone measures 1.4 cm. Common bile duct: Diameter: 5 mm, upper limits of normal. Liver: Normal echogenicity. No focal lesion. Portal vein is patent on color Doppler imaging with normal direction of blood flow towards the liver. Other: None. IMPRESSION: Chololithiasis. The largest stone measures 1.4  cm. Mild gallbladder wall thickening at 4 mm. Cannot assess for Percell Miller sign due to pain medication. Cholecystitis is not excluded. Consider nuclear medicine hepatobiliary scan if further information desired. Electronically Signed   By: Nelson Chimes M.D.   On: 01/02/2022 19:27   ? ?Procedures ?Dr. Georgette Dover (01/03/2022) - Laparoscopic Cholecystectomy with IOC ? ?Hospital Course:  ?Anita [name] is a 21yo female who was [facility] with gallstones.  RUQ US showed multiple stones within the neck of the gallbladder, but no pericholecystic fluid. Given the persistence of her symptoms she was admitted for procedure listed above.  IOC without common bile duct obstruction but did reveal few mobile filling defects favored to represent gas bubbles. Tolerated procedure well and was transferred to the floor.  LFTs were checked postoperatively and remained WNL. Diet was advanced as tolerated.  POn POD1, the patient was voiding well, tolerating diet, ambulating well, pain well controlled, vital signs stable, incisions c/d/i and felt stable for discharge home.  Patient will follow up as below and knows to call with questions or concerns.   ? ?I have personally reviewed the patients medication history on the [place] controlled substance database.  ? ? ?Physical Exam: ?General:  Alert, NAD, pleasant, comfortable ?Pulm: rate and effort normal ?Abd:  Soft, ND, appropriately tender, multiple lap incision with mostly cdi dressings in place - blood noted on periumbilical dressing and one of the lateral dressings  ? ?Allergies as of 01/04/2022   ? ?   Reactions  ? Kiwi Extract Itching, Swelling  ? Gabapentin Other (See Comments)  ? Sleep  ? Mobic [meloxicam] Nausea Only  ? ?  ? ?  ?Medication List  ?  ? ?STOP taking these medications   ? ?meloxicam 15 MG tablet ?Commonly known as: MOBIC ?  ?OLANZapine 2.5 MG tablet ?Commonly known as: ZYPREXA ?  ? ?  ? ?  TAKE these medications   ? ?acetaminophen 500 MG tablet ?Commonly known as:  TYLENOL ?Take 2 tablets (1,000 mg total) by mouth every 6 (six) hours as needed for mild pain. ?  ?amphetamine-dextroamphetamine 30 MG tablet ?Commonly known as: ADDERALL ?Take one tablet (30 mg dose) by mouth 2 (two) times daily. ?What changed:  ?how much to take ?how to take this ?when to take this ?  ?busPIRone 15 MG tablet ?Commonly known as: BUSPAR ?Take one tablet (15 mg dose) by mouth 3 (three) times a day. ?  ?docusate sodium 100 MG capsule ?Commonly known as: COLACE ?Take 1 capsule (100 mg total) by mouth 2 (two) times daily. ?  ?EPINEPHrine 0.3 mg/0.3 mL Soaj injection ?Commonly known as: EPI-PEN ?Inject 0.3 mg into the muscle as needed for anaphylaxis. ?  ?FLUoxetine 40 MG capsule ?Commonly known as: PROZAC ?Take 1 capsule (40 mg dose) by mouth daily. ?(Take one capsule (40 mg dose) by mouth daily.) ?  ?gabapentin 400 MG capsule ?Commonly known as: NEURONTIN ?Take one capsule (400 mg dose) by mouth 3 (three) times a day. ?What changed:  ?how much to take ?how to take this ?when to take this ?  ?ondansetron 4 MG disintegrating tablet ?Commonly known as: ZOFRAN-ODT ?Take 1 tablet (4 mg total) by mouth every 6 (six) hours as needed for nausea. ?  ?oxyCODONE 5 MG immediate release tablet ?Commonly known as: Oxy IR/ROXICODONE ?Take 1 tablet (5 mg total) by mouth every 6 (six) hours as needed for severe pain. ?  ?polyethylene glycol 17 g packet ?Commonly known as: MIRALAX / GLYCOLAX ?Take 17 g by mouth 2 (two) times daily. ?  ?potassium chloride SA 20 MEQ tablet ?Commonly known as: KLOR-CON M ?Take 1 tablet (20 mEq total) by mouth 2 (two) times daily. ?What changed: additional instructions ?  ?pregabalin 25 MG capsule ?Commonly known as: LYRICA ?Take one capsule (25 mg dose) by mouth 2 (two) times daily. Max Daily Amount: 50 mg ?  ?triamcinolone cream 0.1 % ?Commonly known as: KENALOG ?Apply topically 2 (two) times daily. ?What changed:  ?how much to take ?when to take this ?reasons to take this ?  ?Ventolin  HFA 108 (90 Base) MCG/ACT inhaler ?Generic drug: albuterol ?Inhale two puffs into the lungs every 4 (four) hours as needed for Wheezing or Shortness of Breath. ?What changed:  ?how much to take ?when to take this ?reasons to take this ?  ?vitamin B-12 1000 MCG tablet ?Commonly known as: CYANOCOBALAMIN ?Take one tablet (1,000 mcg dose) by mouth daily. ?What changed:  ?how much to take ?how to take this ?when to take this ?  ?Vitamin D (Ergocalciferol) 1.25 MG (50000 UNIT) Caps capsule ?Commonly known as: DRISDOL ?Take 1 capsule by mouth once a week. ?What changed: how much to take ?  ? ?  ? ? ? ? Follow-up Information   ? ? Surgery, [place] Follow up in 3 week(s).   ?Specialty: General Surgery ?Why: 1:45pm, arrive 30 minutes prior to your appointment for paperwork and check in process.  Please bring insurance card and photo ID ?Contact information: ?[address] ?[address] ?[place] [address] ?[phone] ? ? ?  ?  ? ?  ?  ? ?  ? ? ? ?Signed: ?Wellington Hampshire, PA-C ?[facility] ?01/04/2022, 8:31 AM ?Please see Amion for pager number during day hours 7:00am-4:30pm ? ?

## 2022-01-28 ENCOUNTER — Other Ambulatory Visit (HOSPITAL_BASED_OUTPATIENT_CLINIC_OR_DEPARTMENT_OTHER): Payer: Self-pay

## 2022-01-28 MED ORDER — FLUTICASONE PROPIONATE 50 MCG/ACT NA SUSP
NASAL | 0 refills | Status: DC
  Filled 2022-01-28: qty 16, 30d supply, fill #0

## 2022-01-29 ENCOUNTER — Other Ambulatory Visit (HOSPITAL_BASED_OUTPATIENT_CLINIC_OR_DEPARTMENT_OTHER): Payer: Self-pay

## 2022-01-29 MED ORDER — PREDNISONE 10 MG PO TABS
ORAL_TABLET | ORAL | 0 refills | Status: DC
  Filled 2022-01-29: qty 21, 6d supply, fill #0

## 2022-01-29 MED ORDER — AMPHETAMINE-DEXTROAMPHETAMINE 20 MG PO TABS
ORAL_TABLET | ORAL | 0 refills | Status: DC
  Filled 2022-01-29: qty 60, 30d supply, fill #0

## 2022-01-31 ENCOUNTER — Other Ambulatory Visit (HOSPITAL_BASED_OUTPATIENT_CLINIC_OR_DEPARTMENT_OTHER): Payer: Self-pay

## 2022-01-31 MED ORDER — VITAMIN D3 1.25 MG (50000 UT) PO CAPS
1.0000 | ORAL_CAPSULE | ORAL | 0 refills | Status: DC
  Filled 2022-01-31: qty 12, 84d supply, fill #0

## 2022-02-11 ENCOUNTER — Other Ambulatory Visit (HOSPITAL_BASED_OUTPATIENT_CLINIC_OR_DEPARTMENT_OTHER): Payer: Self-pay

## 2022-02-11 MED ORDER — ALBUTEROL SULFATE HFA 108 (90 BASE) MCG/ACT IN AERS
INHALATION_SPRAY | RESPIRATORY_TRACT | 2 refills | Status: DC
  Filled 2022-02-11: qty 18, 17d supply, fill #0

## 2022-02-11 MED ORDER — PREGABALIN 25 MG PO CAPS
ORAL_CAPSULE | ORAL | 2 refills | Status: DC
  Filled 2022-02-11: qty 90, 45d supply, fill #0

## 2022-02-11 MED ORDER — BUSPIRONE HCL 30 MG PO TABS
ORAL_TABLET | ORAL | 1 refills | Status: DC
  Filled 2022-02-11: qty 60, 30d supply, fill #0

## 2022-02-11 MED ORDER — FLUOXETINE HCL 40 MG PO CAPS
ORAL_CAPSULE | ORAL | 1 refills | Status: DC
  Filled 2022-02-11: qty 90, 90d supply, fill #0

## 2022-02-14 ENCOUNTER — Other Ambulatory Visit (HOSPITAL_BASED_OUTPATIENT_CLINIC_OR_DEPARTMENT_OTHER): Payer: Self-pay

## 2022-02-14 MED ORDER — HYDROCOD POLI-CHLORPHE POLI ER 10-8 MG/5ML PO SUER
ORAL | 0 refills | Status: DC
  Filled 2022-02-14: qty 50, 5d supply, fill #0

## 2022-02-14 MED ORDER — AZELASTINE HCL 0.1 % NA SOLN
NASAL | 0 refills | Status: DC
  Filled 2022-02-14: qty 30, 25d supply, fill #0

## 2022-02-19 ENCOUNTER — Other Ambulatory Visit (HOSPITAL_BASED_OUTPATIENT_CLINIC_OR_DEPARTMENT_OTHER): Payer: Self-pay

## 2022-02-19 MED ORDER — AMPHETAMINE-DEXTROAMPHETAMINE 20 MG PO TABS
ORAL_TABLET | ORAL | 0 refills | Status: DC
  Filled 2022-02-19: qty 60, 30d supply, fill #0

## 2022-02-19 MED ORDER — HYDROCOD POLI-CHLORPHE POLI ER 10-8 MG/5ML PO SUER
ORAL | 0 refills | Status: DC
  Filled 2022-02-19: qty 50, 5d supply, fill #0

## 2022-02-20 ENCOUNTER — Other Ambulatory Visit (HOSPITAL_BASED_OUTPATIENT_CLINIC_OR_DEPARTMENT_OTHER): Payer: Self-pay

## 2022-02-20 MED ORDER — PREDNISONE 20 MG PO TABS
ORAL_TABLET | ORAL | 0 refills | Status: DC
  Filled 2022-02-20: qty 10, 5d supply, fill #0

## 2022-02-20 MED ORDER — AZITHROMYCIN 250 MG PO TABS
ORAL_TABLET | ORAL | 0 refills | Status: DC
  Filled 2022-02-20: qty 6, 5d supply, fill #0

## 2022-02-20 MED ORDER — PSEUDOEPH-BROMPHEN-DM 30-2-10 MG/5ML PO SYRP
ORAL_SOLUTION | ORAL | 0 refills | Status: DC
  Filled 2022-02-20: qty 180, 12d supply, fill #0

## 2022-02-22 ENCOUNTER — Encounter (HOSPITAL_COMMUNITY): Payer: Self-pay

## 2022-02-22 ENCOUNTER — Emergency Department (HOSPITAL_COMMUNITY)
Admission: EM | Admit: 2022-02-22 | Discharge: 2022-02-23 | Disposition: A | Discharge status: Other Acute Inpatient Hospital | Payer: Medicaid Other | Source: Home / Self Care | Attending: Emergency Medicine | Admitting: Emergency Medicine

## 2022-02-22 ENCOUNTER — Other Ambulatory Visit: Payer: Self-pay

## 2022-02-22 DIAGNOSIS — X789XXA Intentional self-harm by unspecified sharp object, initial encounter: Secondary | ICD-10-CM | POA: Insufficient documentation

## 2022-02-22 DIAGNOSIS — F332 Major depressive disorder, recurrent severe without psychotic features: Secondary | ICD-10-CM | POA: Insufficient documentation

## 2022-02-22 DIAGNOSIS — S71101A Unspecified open wound, right thigh, initial encounter: Secondary | ICD-10-CM | POA: Insufficient documentation

## 2022-02-22 DIAGNOSIS — F4310 Post-traumatic stress disorder, unspecified: Secondary | ICD-10-CM | POA: Insufficient documentation

## 2022-02-22 DIAGNOSIS — Z20822 Contact with and (suspected) exposure to covid-19: Secondary | ICD-10-CM | POA: Insufficient documentation

## 2022-02-22 DIAGNOSIS — R45851 Suicidal ideations: Secondary | ICD-10-CM

## 2022-02-22 LAB — CBC
HCT: 38 % (ref 36.0–46.0)
Hemoglobin: 13.3 g/dL (ref 12.0–15.0)
MCH: 32 pg (ref 26.0–34.0)
MCHC: 35 g/dL (ref 30.0–36.0)
MCV: 91.3 fL (ref 80.0–100.0)
Platelets: 341 10*3/uL (ref 150–400)
RBC: 4.16 MIL/uL (ref 3.87–5.11)
RDW: 12.8 % (ref 11.5–15.5)
WBC: 11.3 10*3/uL — ABNORMAL HIGH (ref 4.0–10.5)
nRBC: 0 % (ref 0.0–0.2)

## 2022-02-22 LAB — I-STAT BETA HCG BLOOD, ED (MC, WL, AP ONLY): I-stat hCG, quantitative: <5 m[IU]/mL (ref <5)

## 2022-02-22 NOTE — ED Provider Triage Note (Signed)
Emergency Medicine Provider Triage Evaluation Note ? ?Anita [name] , a 21 y.o. female  was evaluated in triage.  Pt complains of suicidal ideation beginning yesterday.  Patient is tearful saying that she does not feel safe and that she "does not want to kill herself" but she is afraid of being alone right now.  She notes that something happened yesterday which triggered her.  She endorses cutting her right anterior thigh, something she has not done since April 2021.  This further triggered her.  She does not have plan, but she does endorse previous history of suicidal attempt by ingesting ibuprofen a few years ago.  Currently denies all physical complaints. ? ?Review of Systems  ?Positive: Suicidal ideation, self-harm ?Negative: Homicidal ideation, visual or auditory hallucinations ? ?Physical Exam  ?BP 132/89 (BP Location: Left Arm)   Pulse (!) 111   Temp 98.2 ?F (36.8 ?C) (Oral)   Resp (!) 23   Ht 5\' 4"  (1.626 m)   Wt 100 kg   SpO2 97%   BMI 37.84 kg/m?  ?Gen:   Awake, crying ?Resp:  Normal effort  ?MSK:   Moves extremities without difficulty  ?Other:  Right anterolateral thigh with 20+ new shallow linear lacerations without evidence of infection or scabbing ? ?Medical Decision Making  ?Medically screening exam initiated at 10:19 PM.  Appropriate orders placed.  Knope was informed that the remainder of the evaluation will be completed by another provider, this initial triage assessment does not replace that evaluation, and the importance of remaining in the ED until their evaluation is complete. ? ? ?  ?Madie Reno, Janell Quiet ?02/22/22 2222 ? ?

## 2022-02-22 NOTE — ED Provider Notes (Signed)
[facility] ?Provider Note ? ? ?CSN: [id number] ?Arrival date & time: 02/22/22  2153 ? ?  ? ?History ? ?Chief Complaint  ?Patient presents with  ? Suicidal  ? ? ?Anita [name] is a 21 y.o. female. ? ?The history is provided by the patient and medical records.  ? ?21 y.o. F with hx of bipolar disorder, obesity, presenting to the ED with SI.  States she has a lot of childhood trauma that has been replaying in her head recently.  She states she truly does not want to harm herself but does not trust herself to be alone.  No specific plan but has "ways that would be easy enough".  Also recently started cutting again which she has not done in about 1 year.  Denies HI/AVH. ? ?Home Medications ?Prior to Admission medications   ?Medication Sig Start Date End Date Taking? Authorizing Provider  ?acetaminophen (TYLENOL) 500 MG tablet Take 2 tablets (1,000 mg total) by mouth every 6 (six) hours as needed for mild pain. 01/04/22   Meuth, Brooke A, PA-C  ?albuterol (VENTOLIN HFA) 108 (90 Base) MCG/ACT inhaler Inhale two puffs into the lungs every 4 (four) hours as needed for Wheezing or Shortness of Breath. ?Patient taking differently: Inhale 2 puffs into the lungs every 4 (four) hours as needed for wheezing or shortness of breath. 09/24/21     ?albuterol (VENTOLIN HFA) 108 (90 Base) MCG/ACT inhaler Inhale two puffs into the lungs every 4 (four) hours as needed for Wheezing or Shortness of Breath. 02/11/22     ?amphetamine-dextroamphetamine (ADDERALL) 20 MG tablet Take one tablet (20 mg dose) by mouth 2 (two) times daily. 01/29/22     ?amphetamine-dextroamphetamine (ADDERALL) 20 MG tablet Take 1 tablet by mouth 2 times daily, max daily dose 40mg . 02/19/22     ?amphetamine-dextroamphetamine (ADDERALL) 30 MG tablet Take one tablet (30 mg dose) by mouth 2 (two) times daily. ?Patient taking differently: Take 30 mg by mouth 2 (two) times daily. 12/06/21     ?azelastine (ASTELIN) 0.1 % nasal spray two  sprays by Both Nostrils route 2 (two) times daily. 02/14/22     ?azithromycin (ZITHROMAX) 250 MG tablet Take 2 tablets by mouth on day 1, then 1 tablet daily for 4 days. 02/20/22     ?brompheniramine-pseudoephedrine-DM 30-2-10 MG/5ML syrup Take 5 mLs by mouth 3 (three) times a day as needed (For cough and congestion) for up to 10 days. 02/20/22     ?busPIRone (BUSPAR) 15 MG tablet Take one tablet (15 mg dose) by mouth 3 (three) times a day. 05/11/21     ?busPIRone (BUSPAR) 30 MG tablet Take one tablet (30 mg dose) by mouth 2 (two) times daily. 02/11/22     ?chlorpheniramine-HYDROcodone 10-8 MG/5ML Take 5 mLs by mouth every 12 (twelve) hours as needed for cough. Max Daily Amount: 10 mLs 02/19/22     ?Cholecalciferol (VITAMIN D3) 1.25 MG (50000 UT) CAPS Take 1 capsule by mouth once a week. 01/31/22     ?docusate sodium (COLACE) 100 MG capsule Take 1 capsule (100 mg total) by mouth 2 (two) times daily. 01/04/22   Meuth, Brooke A, PA-C  ?EPINEPHrine 0.3 mg/0.3 mL IJ SOAJ injection Inject 0.3 mg into the muscle as needed for anaphylaxis. 08/16/20   08/18/20, DO  ?FLUoxetine (PROZAC) 40 MG capsule Take one capsule (40 mg dose) by mouth daily. 11/26/21     ?FLUoxetine (PROZAC) 40 MG capsule Take one capsule (40 mg dose) by mouth  daily. 02/11/22     ?fluticasone (FLONASE) 50 MCG/ACT nasal spray two sprays by Both Nostrils route daily. 01/27/22     ?gabapentin (NEURONTIN) 400 MG capsule Take one capsule (400 mg dose) by mouth 3 (three) times a day. ?Patient taking differently: Take 400 mg by mouth 3 (three) times daily. 09/24/21     ?ondansetron (ZOFRAN-ODT) 4 MG disintegrating tablet Take 1 tablet (4 mg total) by mouth every 6 (six) hours as needed for nausea. 01/04/22   Meuth, Brooke A, PA-C  ?oxyCODONE (OXY IR/ROXICODONE) 5 MG immediate release tablet Take 1 tablet (5 mg total) by mouth every 6 (six) hours as needed for severe pain. 01/04/22   Meuth, Brooke A, PA-C  ?polyethylene glycol (MIRALAX / GLYCOLAX) 17 g packet Take 17 g by  mouth 2 (two) times daily. 01/04/22   Meuth, Brooke A, PA-C  ?potassium chloride SA (KLOR-CON M) 20 MEQ tablet Take 1 tablet (20 mEq total) by mouth 2 (two) times daily. ?Patient taking differently: Take 20 mEq by mouth 2 (two) times daily. For 5 days 01/02/22   Dione BoozeGlick, David, MD  ?predniSONE (DELTASONE) 10 MG tablet Take 6 tabs daily x 1 day, then 5 daily x 1 day, then 4 daily x 1 day, then 3 daily x 1 day, then 2 daily x 1 day, then 1 daily x 1 day then off 01/29/22     ?predniSONE (DELTASONE) 20 MG tablet Take two tablets PO once daily with food for five days 02/20/22     ?pregabalin (LYRICA) 25 MG capsule Take one capsule (25 mg dose) by mouth 2 (two) times daily. Max Daily Amount: 50 mg 12/06/21     ?pregabalin (LYRICA) 25 MG capsule Take one capsule (25 mg dose) by mouth 2 (two) times daily. Max Daily Amount: 50 mg 02/11/22     ?triamcinolone cream (KENALOG) 0.1 % Apply topically 2 (two) times daily. ?Patient taking differently: Apply 1 application. topically 2 (two) times daily as needed (eczema). 05/11/21     ?vitamin B-12 (CYANOCOBALAMIN) 1000 MCG tablet Take one tablet (1,000 mcg dose) by mouth daily. ?Patient taking differently: Take 1,000 mcg by mouth daily. 12/10/21     ?Vitamin D, Ergocalciferol, (DRISDOL) 1.25 MG (50000 UNIT) CAPS capsule Take 1 capsule by mouth once a week. ?Patient taking differently: Take 50,000 Units by mouth once a week. 12/10/21     ?   ? ?Allergies    ?Kiwi extract, Gabapentin, and Mobic [meloxicam]   ? ?Review of Systems   ?Review of Systems  ?Psychiatric/Behavioral:  Positive for suicidal ideas.   ?All other systems reviewed and are negative. ? ?Physical Exam ?Updated Vital Signs ?BP 132/89 (BP Location: Left Arm)   Pulse (!) 111   Temp 98.2 ?F (36.8 ?C) (Oral)   Resp (!) 23   Ht 5\' 4"  (1.626 m)   Wt 100 kg   SpO2 97%   BMI 37.84 kg/m?  ? ?Physical Exam ?Vitals and nursing note reviewed.  ?Constitutional:   ?   Appearance: She is well-developed.  ?HENT:  ?   Head: Normocephalic  and atraumatic.  ?Eyes:  ?   Conjunctiva/sclera: Conjunctivae normal.  ?   Pupils: Pupils are equal, round, and reactive to light.  ?Cardiovascular:  ?   Rate and Rhythm: Normal rate and regular rhythm.  ?   Heart sounds: Normal heart sounds.  ?Pulmonary:  ?   Effort: Pulmonary effort is normal.  ?   Breath sounds: Normal breath sounds. No rhonchi.  ?Abdominal:  ?  General: Bowel sounds are normal.  ?   Palpations: Abdomen is soft.  ?Musculoskeletal:     ?   General: Normal range of motion.  ?   Cervical back: Normal range of motion.  ?   Comments: Right anterior thigh with numerous superficial wounds, no active bleeding, no signs of infection  ?Skin: ?   General: Skin is warm and dry.  ?Neurological:  ?   Mental Status: She is alert and oriented to person, place, and time.  ?Psychiatric:  ?   Comments: SI without voiced plan ?Denies HI/AVH  ? ? ?ED Results / Procedures / Treatments   ?Labs ?(all labs ordered are listed, but only abnormal results are displayed) ?Labs Reviewed  ?COMPREHENSIVE METABOLIC PANEL - Abnormal; Notable for the following components:  ?    Result Value  ? Glucose, Bld 101 (*)   ? Calcium 8.6 (*)   ? All other components within normal limits  ?SALICYLATE LEVEL - Abnormal; Notable for the following components:  ? Salicylate Lvl <7.0 (*)   ? All other components within normal limits  ?ACETAMINOPHEN LEVEL - Abnormal; Notable for the following components:  ? Acetaminophen (Tylenol), Serum <10 (*)   ? All other components within normal limits  ?CBC - Abnormal; Notable for the following components:  ? WBC 11.3 (*)   ? All other components within normal limits  ?RAPID URINE DRUG SCREEN, HOSP PERFORMED - Abnormal; Notable for the following components:  ? Opiates POSITIVE (*)   ? Amphetamines POSITIVE (*)   ? Tetrahydrocannabinol POSITIVE (*)   ? All other components within normal limits  ?RESP PANEL BY RT-PCR (FLU A&B, COVID) ARPGX2  ?ETHANOL  ?I-STAT BETA HCG BLOOD, ED ([facility], [facility], [facility] ONLY)   ? ? ?EKG ?None ? ?Radiology ?No results found. ? ?Procedures ?Procedures  ? ? ?Medications Ordered in ED ?Medications - No data to display ? ?ED Course/ Medical Decision Making/ A&P ?  ?                        ?Medical Dec

## 2022-02-22 NOTE — ED Triage Notes (Signed)
Patient said she does not feel safe with herself. She has had childhood trauma that is relapsing in her head. 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 She does not want to be alone with herself. Has been feeling like this since last night. She said she is suicidal. No specific plan but said she knows ways that are easy. Not homicidal.  ?

## 2022-02-23 ENCOUNTER — Encounter (HOSPITAL_COMMUNITY): Payer: Self-pay | Admitting: Nurse Practitioner

## 2022-02-23 ENCOUNTER — Inpatient Hospital Stay (HOSPITAL_COMMUNITY)
Admission: AD | Admit: 2022-02-23 | Discharge: 2022-02-28 | DRG: 885 | Disposition: A | Discharge status: Home / Self Care | Payer: Medicaid Other | Source: Intra-hospital | Attending: Psychiatry | Admitting: Psychiatry

## 2022-02-23 DIAGNOSIS — S71101A Unspecified open wound, right thigh, initial encounter: Secondary | ICD-10-CM | POA: Diagnosis present

## 2022-02-23 DIAGNOSIS — F1210 Cannabis abuse, uncomplicated: Secondary | ICD-10-CM | POA: Diagnosis present

## 2022-02-23 DIAGNOSIS — F259 Schizoaffective disorder, unspecified: Secondary | ICD-10-CM | POA: Diagnosis present

## 2022-02-23 DIAGNOSIS — Z20822 Contact with and (suspected) exposure to covid-19: Secondary | ICD-10-CM | POA: Diagnosis present

## 2022-02-23 DIAGNOSIS — M797 Fibromyalgia: Secondary | ICD-10-CM | POA: Diagnosis present

## 2022-02-23 DIAGNOSIS — G4700 Insomnia, unspecified: Secondary | ICD-10-CM | POA: Diagnosis present

## 2022-02-23 DIAGNOSIS — J302 Other seasonal allergic rhinitis: Secondary | ICD-10-CM | POA: Diagnosis present

## 2022-02-23 DIAGNOSIS — F251 Schizoaffective disorder, depressive type: Secondary | ICD-10-CM | POA: Diagnosis not present

## 2022-02-23 DIAGNOSIS — R45851 Suicidal ideations: Secondary | ICD-10-CM | POA: Diagnosis present

## 2022-02-23 DIAGNOSIS — F329 Major depressive disorder, single episode, unspecified: Secondary | ICD-10-CM | POA: Diagnosis present

## 2022-02-23 DIAGNOSIS — K5900 Constipation, unspecified: Secondary | ICD-10-CM | POA: Diagnosis present

## 2022-02-23 DIAGNOSIS — F4310 Post-traumatic stress disorder, unspecified: Secondary | ICD-10-CM | POA: Diagnosis present

## 2022-02-23 DIAGNOSIS — F17210 Nicotine dependence, cigarettes, uncomplicated: Secondary | ICD-10-CM | POA: Diagnosis present

## 2022-02-23 DIAGNOSIS — F332 Major depressive disorder, recurrent severe without psychotic features: Secondary | ICD-10-CM | POA: Diagnosis present

## 2022-02-23 DIAGNOSIS — F411 Generalized anxiety disorder: Secondary | ICD-10-CM | POA: Diagnosis present

## 2022-02-23 DIAGNOSIS — G894 Chronic pain syndrome: Secondary | ICD-10-CM | POA: Diagnosis present

## 2022-02-23 LAB — COMPREHENSIVE METABOLIC PANEL
ALT: 8 U/L (ref 0–44)
AST: 16 U/L (ref 15–41)
Albumin: 4 g/dL (ref 3.5–5.0)
Alkaline Phosphatase: 62 U/L (ref 38–126)
Anion gap: 7 (ref 5–15)
BUN: 11 mg/dL (ref 6–20)
CO2: 24 mmol/L (ref 22–32)
Calcium: 8.6 mg/dL — ABNORMAL LOW (ref 8.9–10.3)
Chloride: 110 mmol/L (ref 98–111)
Creatinine, Ser: 0.62 mg/dL (ref 0.44–1.00)
GFR, Estimated: >60 mL/min (ref >60)
Glucose, Bld: 101 mg/dL — ABNORMAL HIGH (ref 70–99)
Potassium: 3.6 mmol/L (ref 3.5–5.1)
Sodium: 141 mmol/L (ref 135–145)
Total Bilirubin: 0.6 mg/dL (ref 0.3–1.2)
Total Protein: 7.1 g/dL (ref 6.5–8.1)

## 2022-02-23 LAB — RAPID URINE DRUG SCREEN, HOSP PERFORMED
Amphetamines: POSITIVE — AB
Barbiturates: NOT DETECTED
Benzodiazepines: NOT DETECTED
Cocaine: NOT DETECTED
Opiates: POSITIVE — AB
Tetrahydrocannabinol: POSITIVE — AB

## 2022-02-23 LAB — RESP PANEL BY RT-PCR (FLU A&B, COVID) ARPGX2
Influenza A by PCR: NEGATIVE
Influenza B by PCR: NEGATIVE
SARS Coronavirus 2 by RT PCR: NEGATIVE

## 2022-02-23 LAB — SALICYLATE LEVEL: Salicylate Lvl: <7 mg/dL — ABNORMAL LOW (ref 7.0–30.0)

## 2022-02-23 LAB — ACETAMINOPHEN LEVEL: Acetaminophen (Tylenol), Serum: <10 ug/mL — ABNORMAL LOW (ref 10–30)

## 2022-02-23 LAB — ETHANOL: Alcohol, Ethyl (B): <10 mg/dL (ref <10)

## 2022-02-23 MED ORDER — AZITHROMYCIN 250 MG PO TABS
250.0000 mg | ORAL_TABLET | Freq: Every day | ORAL | Status: AC
  Administered 2022-02-23: 250 mg via ORAL
  Filled 2022-02-23 (×2): qty 1

## 2022-02-23 MED ORDER — MAGNESIUM HYDROXIDE 400 MG/5ML PO SUSP: 30.0000 mL | Freq: Every day | ORAL | Status: DC | PRN

## 2022-02-23 MED ORDER — VITAMIN D (ERGOCALCIFEROL) 1.25 MG (50000 UNIT) PO CAPS
50000.0000 [IU] | ORAL_CAPSULE | ORAL | Status: DC
  Administered 2022-02-23: 50000 [IU] via ORAL
  Filled 2022-02-23 (×2): qty 1

## 2022-02-23 MED ORDER — ALBUTEROL SULFATE HFA 108 (90 BASE) MCG/ACT IN AERS: 2.0000 | INHALATION_SPRAY | RESPIRATORY_TRACT | Status: DC | PRN

## 2022-02-23 MED ORDER — ACETAMINOPHEN 325 MG PO TABS
650.0000 mg | ORAL_TABLET | Freq: Four times a day (QID) | ORAL | Status: DC | PRN
  Administered 2022-02-23 – 2022-02-25 (×3): 650 mg via ORAL
  Filled 2022-02-23 (×3): qty 2

## 2022-02-23 MED ORDER — HYDROXYZINE HCL 25 MG PO TABS
25.0000 mg | ORAL_TABLET | Freq: Three times a day (TID) | ORAL | Status: DC | PRN
  Administered 2022-02-23 – 2022-02-27 (×5): 25 mg via ORAL
  Filled 2022-02-23 (×5): qty 1

## 2022-02-23 MED ORDER — GABAPENTIN 400 MG PO CAPS
400.0000 mg | ORAL_CAPSULE | Freq: Three times a day (TID) | ORAL | Status: DC
  Filled 2022-02-23 (×9): qty 1

## 2022-02-23 MED ORDER — ALUM & MAG HYDROXIDE-SIMETH 200-200-20 MG/5ML PO SUSP: 30.0000 mL | ORAL | Status: DC | PRN

## 2022-02-23 MED ORDER — DOCUSATE SODIUM 100 MG PO CAPS
100.0000 mg | ORAL_CAPSULE | Freq: Two times a day (BID) | ORAL | Status: DC
  Administered 2022-02-23 – 2022-02-28 (×6): 100 mg via ORAL
  Filled 2022-02-23 (×16): qty 1

## 2022-02-23 MED ORDER — VITAMIN B-12 1000 MCG PO TABS
1000.0000 ug | ORAL_TABLET | Freq: Every day | ORAL | Status: DC
  Administered 2022-02-23 – 2022-02-28 (×6): 1000 ug via ORAL
  Filled 2022-02-23 (×9): qty 1

## 2022-02-23 MED ORDER — PREDNISONE 20 MG PO TABS
40.0000 mg | ORAL_TABLET | Freq: Every day | ORAL | Status: AC
  Administered 2022-02-24: 40 mg via ORAL
  Filled 2022-02-23: qty 2

## 2022-02-23 MED ORDER — FLUOXETINE HCL 20 MG PO CAPS
40.0000 mg | ORAL_CAPSULE | Freq: Every day | ORAL | Status: DC
Start: 2022-02-23 — End: 2022-02-24
  Administered 2022-02-23 – 2022-02-24 (×2): 40 mg via ORAL
  Filled 2022-02-23 (×6): qty 2

## 2022-02-23 MED ORDER — BUSPIRONE HCL 15 MG PO TABS
15.0000 mg | ORAL_TABLET | Freq: Two times a day (BID) | ORAL | Status: DC
  Administered 2022-02-23 – 2022-02-26 (×6): 15 mg via ORAL
  Filled 2022-02-23 (×10): qty 1

## 2022-02-23 MED ORDER — TRAZODONE HCL 50 MG PO TABS
50.0000 mg | ORAL_TABLET | Freq: Every evening | ORAL | Status: DC | PRN
  Administered 2022-02-23 – 2022-02-24 (×2): 50 mg via ORAL
  Filled 2022-02-23 (×2): qty 1

## 2022-02-23 NOTE — Plan of Care (Signed)
See care plan
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
## 2022-02-23 NOTE — Tx Team (Signed)
Initial Treatment Plan ?02/23/2022 ?2:56 PM ?Anita [name] ?[id number] ? ? ? ?PATIENT STRESSORS: ?Marital or family conflict   ?Traumatic event   ? ? ?PATIENT STRENGTHS: ?Ability for insight  ?Communication skills  ?Supportive family/friends  ? ? ?PATIENT IDENTIFIED PROBLEMS: ?Patient recognizes she has significant trauma from her childhood she needs to work on.  ?  ?  ?  ?  ?  ?  ?  ?  ?  ? ?DISCHARGE CRITERIA:  ?Ability to meet basic life and health needs ?Adequate post-discharge living arrangements ?Improved stabilization in mood, thinking, and/or behavior ?Safe-care adequate arrangements made ?Verbal commitment to aftercare and medication compliance ? ?PRELIMINARY DISCHARGE PLAN: ?Return to previous living arrangement ?Return to previous work or school arrangements ? ?PATIENT/FAMILY INVOLVEMENT: ?This treatment plan has been presented to and reviewed with the patient, Anita [name].  The patient and family have been given the opportunity to ask questions and make suggestions. ? ?Sharen Hint, RN ?02/23/2022, 2:56 PM ?

## 2022-02-23 NOTE — Progress Notes (Signed)
Patient states she has chronic pain in her back and believes she has fibromyalgia ?

## 2022-02-23 NOTE — ED Notes (Addendum)
Patient dressed out into burgandy scrubs and wanded by security. 1 bag of belongings located at triage nurse station.  ?

## 2022-02-23 NOTE — ED Provider Notes (Signed)
Emergency Medicine Observation Re-evaluation Note ? ?Anita [name] is a 21 y.o. female, seen on rounds today at 0700.  Pt initially presented to the ED for complaints of Suicidal ?Currently, the patient is resting comfortably. ? ?Physical Exam  ?BP 119/87   Pulse 92   Temp 98 ?F (36.7 ?C) (Oral)   Resp 13   Ht 5\' 4"  (1.626 m)   Wt 100 kg   SpO2 98%   BMI 37.84 kg/m?  ?Physical Exam ?General: NAD ? ? ?ED Course / MDM  ?EKG:  ? ?I have reviewed the labs performed to date as well as medications administered while in observation.  Recent changes in the last 24 hours include no acute events reported. ? ?Plan  ?Current plan is for psych placement. ? [facility] is not under involuntary commitment. ? ? ?  ?Madie Reno, MD ?02/23/22 [phone] ? ?

## 2022-02-23 NOTE — ED Notes (Signed)
TTS completed. Anita Bobbitt, NP recommends inpatient psychiatric treatment. AC at [facility] will review for possible admission. 

## 2022-02-23 NOTE — ED Notes (Signed)
Safe Transport outside to take pt to [facility].  ?

## 2022-02-23 NOTE — [facility] Group Notes (Signed)
.  Psychoeducational Group Note ? ? ? ?Date:  5/6//23 ?Time: 1300-1400 ? ? ? ?Purpose of Group: . The group focus' on teaching patients on how to identify their needs and their Life Skills:  [name] group where two lists are made. What people need and what are things that we do that are unhealthy. The lists are developed by the patients and it is explained that we often do the actions that are not healthy to get our list of needs met. ? ?Goal:: to develop the coping skills needed to get their needs met ? ?Participation Level: did not attend ? ?Anita [name] ? ?

## 2022-02-23 NOTE — Plan of Care (Signed)
  Problem: Education: Goal: Ability to make informed decisions regarding treatment will improve Outcome: Progressing   Problem: Coping: Goal: Coping ability will improve Outcome: Progressing   Problem: Health Behavior/Discharge Planning: Goal: Identification of resources available to assist in meeting health care needs will improve Outcome: Progressing   Problem: Medication: Goal: Compliance with prescribed medication regimen will improve Outcome: Progressing   Problem: Self-Concept: Goal: Ability to disclose and discuss suicidal ideas will improve Outcome: Progressing Goal: Will verbalize positive feelings about self Outcome: Progressing   Problem: Education: Goal: Knowledge of [facility] Education information/materials will improve Outcome: Progressing Goal: Emotional status will improve Outcome: Progressing Goal: Mental status will improve Outcome: Progressing Goal: Verbalization of understanding the information provided will improve Outcome: Progressing   Problem: Activity: Goal: Interest or engagement in activities will improve Outcome: Progressing Goal: Sleeping patterns will improve Outcome: Progressing   Problem: Coping: Goal: Ability to verbalize frustrations and anger appropriately will improve Outcome: Progressing Goal: Ability to demonstrate self-control will improve Outcome: Progressing   Problem: Health Behavior/Discharge Planning: Goal: Identification of resources available to assist in meeting health care needs will improve Outcome: Progressing Goal: Compliance with treatment plan for underlying cause of condition will improve Outcome: Progressing   Problem: Physical Regulation: Goal: Ability to maintain clinical measurements within normal limits will improve Outcome: Progressing   Problem: Safety: Goal: Periods of time without injury will increase Outcome: Progressing   

## 2022-02-23 NOTE — Progress Notes (Signed)
Per Leonia Reader, [facility], pt has been accepted to [facility] bed [id number]. Accepting provider is Abigail Miyamoto, Attending provider is Dr. Magdalen Spatz. Patient can arrive by 11:00am. Number for report is [phone]. Please fax voluntary form to fax number [phone] before call report.  ? ?Glennie Isle, MSW, LCSW-A ?Phone: [phone] ?Disposition/TOC ? ? ?

## 2022-02-23 NOTE — ED Notes (Signed)
[facility] called for transport needs. States it will be approx 2 hours before they are able to transport pt to [facility] due to other transports they have.  ?

## 2022-02-23 NOTE — Progress Notes (Signed)
?   02/23/22 2245  ?Psych Admission Type (Psych Patients Only)  ?Admission Status Voluntary  ?Psychosocial Assessment  ?Patient Complaints Anxiety  ?Eye Contact Brief  ?Facial Expression Anxious  ?Affect Appropriate to circumstance  ?Speech Logical/coherent  ?Interaction Assertive  ?Motor Activity Fidgety  ?Appearance/Hygiene Improved  ?Behavior Characteristics Appropriate to situation  ?Mood Anxious  ?Thought Process  ?Coherency WDL  ?Content WDL  ?Delusions None reported or observed  ?Perception Hallucinations  ?Hallucination Auditory;Visual  ?Judgment Limited  ?Confusion None  ?Danger to Self  ?Current suicidal ideation? Denies  ?Self-Injurious Behavior No self-injurious ideation or behavior indicators observed or expressed   ?Agreement Not to Harm Self Yes  ?Description of Agreement verbal  ?Danger to Others  ?Danger to Others None reported or observed  ? ? ?

## 2022-02-23 NOTE — ED Notes (Signed)
Report given to [facility] @ [facility]. Will transport pt at 10:15.  ?

## 2022-02-23 NOTE — Progress Notes (Signed)
Pt arrived to [facility] at 1130. Admission note to follow ?
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
## 2022-02-24 DIAGNOSIS — F4310 Post-traumatic stress disorder, unspecified: Secondary | ICD-10-CM | POA: Diagnosis present

## 2022-02-24 DIAGNOSIS — J302 Other seasonal allergic rhinitis: Secondary | ICD-10-CM | POA: Diagnosis present

## 2022-02-24 DIAGNOSIS — M797 Fibromyalgia: Secondary | ICD-10-CM | POA: Diagnosis present

## 2022-02-24 DIAGNOSIS — F259 Schizoaffective disorder, unspecified: Principal | ICD-10-CM | POA: Diagnosis present

## 2022-02-24 DIAGNOSIS — F329 Major depressive disorder, single episode, unspecified: Secondary | ICD-10-CM | POA: Diagnosis present

## 2022-02-24 DIAGNOSIS — F411 Generalized anxiety disorder: Secondary | ICD-10-CM | POA: Diagnosis present

## 2022-02-24 DIAGNOSIS — G894 Chronic pain syndrome: Secondary | ICD-10-CM | POA: Diagnosis present

## 2022-02-24 LAB — LIPID PANEL
Cholesterol: 206 mg/dL — ABNORMAL HIGH (ref 0–200)
HDL: 59 mg/dL (ref >40)
LDL Cholesterol: 129 mg/dL — ABNORMAL HIGH (ref 0–99)
Total CHOL/HDL Ratio: 3.5 RATIO
Triglycerides: 88 mg/dL (ref <150)
VLDL: 18 mg/dL (ref 0–40)

## 2022-02-24 LAB — TSH: TSH: 0.589 u[IU]/mL (ref 0.350–4.500)

## 2022-02-24 MED ORDER — GABAPENTIN 400 MG PO CAPS
400.0000 mg | ORAL_CAPSULE | Freq: Every day | ORAL | Status: DC
  Administered 2022-02-25: 400 mg via ORAL
  Filled 2022-02-24 (×2): qty 1

## 2022-02-24 MED ORDER — ARIPIPRAZOLE 5 MG PO TABS
5.0000 mg | ORAL_TABLET | Freq: Every day | ORAL | Status: DC
  Administered 2022-02-24: 5 mg via ORAL
  Filled 2022-02-24 (×4): qty 1

## 2022-02-24 MED ORDER — NICOTINE 21 MG/24HR TD PT24
21.0000 mg | MEDICATED_PATCH | Freq: Every day | TRANSDERMAL | Status: DC
Start: 2022-02-24 — End: 2022-02-28
  Administered 2022-02-24 – 2022-02-28 (×5): 21 mg via TRANSDERMAL
  Filled 2022-02-24 (×8): qty 1

## 2022-02-24 MED ORDER — FLUTICASONE PROPIONATE 50 MCG/ACT NA SUSP
2.0000 | Freq: Every day | NASAL | Status: DC
  Administered 2022-02-24 – 2022-02-28 (×5): 2 via NASAL
  Filled 2022-02-24 (×2): qty 16

## 2022-02-24 MED ORDER — GABAPENTIN 100 MG PO CAPS
100.0000 mg | ORAL_CAPSULE | Freq: Two times a day (BID) | ORAL | Status: DC
  Administered 2022-02-24 – 2022-02-26 (×4): 100 mg via ORAL
  Filled 2022-02-24 (×8): qty 1

## 2022-02-24 MED ORDER — LORATADINE 10 MG PO TABS
10.0000 mg | ORAL_TABLET | Freq: Every day | ORAL | Status: DC
  Administered 2022-02-25 – 2022-02-28 (×4): 10 mg via ORAL
  Filled 2022-02-24 (×6): qty 1
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
## 2022-02-24 NOTE — [facility] Group Notes (Signed)
Adult Psychoeducational Group  ?Date:  02/24/2022 ?Time:  1300-1400 ? ?Group Topic/Focus: Continuation of the group from Saturday. Looking at the lists that were created and talking about what needs to be done with the homework of 30 positives about themselves.  ?                                   Talking about taking their power back and helping themselves to develop [name] positive self esteem. ?     ?Participation Quality:  Appropriate ? ?Affect:  Appropriate ? ?Cognitive:  Oriented ? ?Insight: Improving ? ?Engagement in Group:  Engaged ? ?Modes of Intervention:  Activity, Discussion, Education, and Support ? ?Additional Comments:  Rates her energy at [name] 7-8/10. Participated fully in the group. ? ?Anita [name] ? ?

## 2022-02-24 NOTE — Progress Notes (Signed)
D. Pt presents with a depressed affect /anxious mood - calm, cooperative behavior- Pt reported poor sleep, poor appetite and poor concentration today. Per pt's self inventory, pt rated her depression,hopelessness and anxiety a 5/2/7, respectively. Pt currently denies SI/HI and AVH and does not appear to be responding to internal stimuli. Pt has been visible in the milieu interacting appropriately with peers and staff, and observed attending groups.A. Labs and vitals monitored. Pt given and educated on medications. Pt supported emotionally and encouraged to express concerns and ask questions.   ?R. Pt remains safe with 15 minute checks. Will continue POC. ? ?  ?

## 2022-02-24 NOTE — Progress Notes (Signed)
?   02/24/22 2200  ?Psych Admission Type (Psych Patients Only)  ?Admission Status Voluntary  ?Psychosocial Assessment  ?Patient Complaints Anxiety  ?Eye Contact Brief  ?Facial Expression Anxious  ?Affect Anxious  ?Speech Logical/coherent  ?Interaction Assertive  ?Motor Activity Fidgety  ?Appearance/Hygiene Unremarkable  ?Behavior Characteristics Cooperative  ?Mood Anxious  ?Aggressive Behavior  ?Effect No apparent injury  ?Thought Process  ?Coherency WDL  ?Content WDL  ?Delusions None reported or observed  ?Perception Hallucinations  ?Hallucination Auditory  ?Judgment Limited  ?Confusion None  ?Danger to Self  ?Current suicidal ideation? Denies  ?Agreement Not to Harm Self Yes  ?Description of Agreement verbal contract for safety  ? ? ?

## 2022-02-24 NOTE — Progress Notes (Signed)
[facility] Group Notes:  (Nursing/MHT/Case Management/Adjunct) ? ?Date:  02/24/2022  ?Time:  2015 ? ?Type of Therapy:   wrap up group ? ?Participation Level:  Active ? ?Participation Quality:  Appropriate, Attentive, Sharing, and Supportive ? ?Affect:  Depressed and Irritable ? ?Cognitive:  Alert ? ?Insight:  Improving ? ?Engagement in Group:  Engaged ? ?Modes of Intervention:  Clarification, Education, and Socialization ? ?Summary of Progress/Problems: Positive thinking and self-care were discussed.  ? ?Anita [name] S ?02/24/2022, 11:32 PM ?

## 2022-02-24 NOTE — Group Note (Signed)
[facility] LCSW Group Therapy Note ? ?Date/Time:  02/24/2022   ? ?Type of Therapy and Topic:  Group Therapy:  Using Music to Encourage Yourself ? ?Participation Level:  Active  ? ?Description of Group: ?In this process group, members listened to a variety of music through choosing from CSW's list #1 through #25.  Patients identified the messages received from those songs and how the music affected their emotions.  Patients were encouraged to use music as a coping skill at home, but to be mindful of the choices made.  Patients discussed how this knowledge can help with wellness and recovery in various ways including managing depression and anxiety as well as encouraging healthy sleep habits.   ? ?Therapeutic Goals: ?Patients will explore the impact of different songs on mood ?Patients will verbalize the thoughts they have when listening to different types of music ?Patients will identify music that is soothing to them as well as music that is energizing to them ?Patients will discuss how to use this knowledge to assist in maintaining wellness and recovery ?Patients will explore the use of music as a coping skill ?Patients will encourage one another ? ?Summary of Patient Progress:  At the beginning of group, patient was not yet present so she did not answer the introductory question about what music does for patients in their lives; however, once she arrived she participated fully ? ?Therapeutic Modalities: ?Solution Focused Brief Therapy ?Activity ? ? ?Ambrose Mantle, LCSW ?  ?
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
## 2022-02-24 NOTE — [facility] Group Notes (Signed)
Adult Psychoeducational Group Not ?Date:  02/24/2022 ?Time:  0900-1045 ?Group Topic/Focus: PROGRESSIVE RELAXATION. A group where deep breathing is taught and tensing and relaxation muscle groups is used. Imagery is used as well.  Pts are asked to imagine 3 pillars that hold them up when they are not able to hold themselves up and to share that with the group. ? ?Participation Level:  did not attend ?Kiira Brach A ?

## 2022-02-24 NOTE — [facility] Suicide Risk Assessment (Addendum)
Suicide Risk Assessment ? ?Admission Assessment    ?[facility] Admission Suicide Risk Assessment ? ? ?Nursing information obtained from:  Patient ?Demographic factors:  Anita [name], lesbian, or bisexual orientation, Caucasian, Adolescent or young adult ?Current Mental Status:  Suicidal ideation indicated by patient, Self-harm thoughts, Self-harm behaviors ?Loss Factors:  Loss of significant relationship ?Historical Factors:  Prior suicide attempts ?Risk Reduction Factors:  Positive social support, Sense of responsibility to family ? ?Total Time spent with patient: 1.5 hours ?Principal Problem: Schizoaffective disorder (HCC) ?Diagnosis:  Principal Problem: ?  Schizoaffective disorder (HCC) ?Active Problems: ?  GAD (generalized anxiety disorder) ?  Chronic pain syndrome ?  Fibromyalgia ?  Seasonal allergies ?  PTSD (post-traumatic stress disorder) ?  MDD (major depressive disorder) ? ?History of Present Illness: Anita [name] is a 21 y.o Caucasian female who presented to the [facility] urgent [facility]) with worsening depressive symptoms, +suicidal thoughts & self injurious behaviors. She was deemed to be a danger to self and transferred  voluntarily to this [facility] for treatment and stabilization of her mood. ? ?Continued Clinical Symptoms: Pt reports poor concentration levels, fatigue, irritability, racing thoughts, low energy levels, anhedonia, anxiety, panic symptoms, insomnia, poor appetite with periods of anorexia followed by binge eating, but no purging. She also reports feelings of hopelessness, helplessness & worthlessness x a few weeks prior to this hospitalization. Pt also has psychosis, states that she has +VH of the dead, and is able to communicate with the dead using "feelings". Continuous hospitalization is necessary to treat and stabilize her mood. ? ?Alcohol Use Disorder Identification Test Final Score (AUDIT): 4 ?The "Alcohol Use Disorders Identification Test", Guidelines for Use in  Primary Care, Second Edition.  World Science writer [facility]). ?Score between 0-7:  no or low risk or alcohol related problems. ?Score between 8-15:  moderate risk of alcohol related problems. ?Score between 16-19:  high risk of alcohol related problems. ?Score 20 or above:  warrants further diagnostic evaluation for alcohol dependence and treatment. ? ?CLINICAL FACTORS:  ? Panic Attacks ?Depression:   Anhedonia ?Delusional ?Hopelessness ?Impulsivity ?Insomnia ?Severe ?Currently Psychotic ?Previous Psychiatric Diagnoses and Treatments ?Medical Diagnoses and Treatments/Surgeries ? ?Musculoskeletal: ?Strength & Muscle Tone: within normal limits ?Gait & Station: normal ?Patient leans: N/A ? ?Psychiatric Specialty Exam: ? ?Presentation  ?General Appearance: Appropriate for Environment; Fairly Groomed ? ?Eye Contact:Fair ? ?Speech:Clear and Coherent ? ?Speech Volume:Normal ? ?Handedness:Right ? ?Mood and Affect  ?Mood:Depressed ? ?Affect:Congruent ? ?Thought Process  ?Thought Processes:Coherent ? ?Descriptions of Associations:Intact ? ?Orientation:Full (Time, Place and Person) ? ?Thought Content:Logical ? ?History of Schizophrenia/Schizoaffective disorder:No ? ?Duration of Psychotic Symptoms:No data recorded ?Hallucinations:Hallucinations: Visual ?Description of Visual Hallucinations: Dead people ? ?Ideas of Reference:Delusions; Paranoia ? ?Suicidal Thoughts:Suicidal Thoughts: Yes, Passive ?SI Passive Intent and/or Plan: Without Intent ? ?Homicidal Thoughts:Homicidal Thoughts: No ? ?Sensorium  ?Memory:Immediate Good ? ?Judgment:Poor ? ?Insight:Poor ? ?Executive Functions  ?Concentration:Fair ? ?Attention Span:Fair ? ?Recall:Fair ? ?Fund of Knowledge:Fair ? ?Language:Fair ? ?Psychomotor Activity  ?Psychomotor Activity:Psychomotor Activity: Normal ? ?Assets  ?Assets:Communication Skills; Social Support; Housing ? ?Sleep  ?Sleep:Sleep: Poor ? ?Physical Exam: ?Physical Exam ?Constitutional:   ?   Appearance: Normal  appearance.  ?HENT:  ?   Head: Normocephalic.  ?   Nose: Nose normal. No congestion or rhinorrhea.  ?Eyes:  ?   Pupils: Pupils are equal, round, and reactive to light.  ?Cardiovascular:  ?   Pulses: Normal pulses.  ?Pulmonary:  ?   Effort: Pulmonary effort is normal. No  respiratory distress.  ?Musculoskeletal:     ?   General: Normal range of motion.  ?   Cervical back: Normal range of motion.  ?Neurological:  ?   Mental Status: She is alert and oriented to person, place, and time.  ?   Coordination: Coordination normal.  ? ?Review of Systems  ?Constitutional: Negative.  Negative for fever.  ?HENT: Negative.    ?Eyes: Negative.   ?Respiratory: Negative.  Negative for cough.   ?Cardiovascular: Negative.  Negative for chest pain.  ?Gastrointestinal: Negative.  Negative for heartburn.  ?Genitourinary: Negative.   ?Musculoskeletal: Negative.   ?Skin: Negative.   ?Neurological: Negative.   ?Psychiatric/Behavioral:  Positive for depression, hallucinations (+VH of the deceased), substance abuse (THC & Nicotine abuse) and suicidal ideas (currently with passive SI, no plan). The patient is nervous/anxious and has insomnia.   ?Blood pressure (!) 115/58, pulse 89, temperature 97.9 ?F (36.6 ?C), temperature source Oral, resp. rate 16, height  (1.626 m), weight 103.4 kg, SpO2 100 %. Body mass index is 39.14 kg/m?. ? ?COGNITIVE FEATURES THAT CONTRIBUTE TO RISK:  ?None   ? ?SUICIDE RISK:  ? Moderate:  Frequent suicidal ideation with limited intensity, and duration, some specificity in terms of plans, no associated intent, good self-control, limited dysphoria/symptomatology, some risk factors present, and identifiable protective factors, including available and accessible social support. ? ?PLAN OF CARE:  ? ?Safety and Monitoring: ?Voluntary admission to inpatient psychiatric unit for safety, stabilization and treatment ?Daily contact with patient to assess and evaluate symptoms and progress in treatment ?Patient's case to be  discussed in multi-disciplinary team meeting ?Observation Level : q15 minute checks ?Vital signs: q12 hours ?Precautions: suicide, elopement, and assault ?  ?Long Term Goal(s): Improvement in symptoms so as ready for discharge ?  ?Short Term Goals: Ability to identify changes in lifestyle to reduce recurrence of condition will improve, Ability to verbalize feelings will improve, Ability to disclose and discuss suicidal ideas, Ability to demonstrate self-control will improve, Ability to identify and develop effective coping behaviors will improve, Compliance with prescribed medications will improve, and Ability to identify triggers associated with substance abuse/mental health issues will improve ?  ?                      Diagnoses ?Principal Problem: ?  Schizoaffective disorder (HCC) ?Active Problems: ?  GAD (generalized anxiety disorder) ?  Chronic pain syndrome ?  Fibromyalgia ?  Seasonal allergies ?  PTSD (post-traumatic stress disorder) ?  MDD (major depressive disorder) ?  ?                       Medications ?-Discontinue Prozac d/t complaints of ineffectiveness ?-Start Abilify 5 mg nightly for mood stabilization & Psychosis ?-Change Gabapentin to 100 mg in AM, 100 mg in the afternoon & 400 mg    nightly for chronic pain (complaints of sedation while on 400 mg TID) ?-Start Claritin daily for seasonal allergies ?-Start Flonase daily for Seasonal allergies ?-Continue Colace 100 mg daily for constipation ?-Continue Buspar 15 mg BID for anxiety ?-Continue Hydroxyzine 25 mg every 6 hours PRN ?-Continue Albuterol inhaler PRN for SOB/Wheezing ?-Continue Drisdol 50,000 units weekly ?-Continue Vitamin B-12 1,000 mcg daily ?  ?Other PRNS ?-Continue Tylenol 650 mg every 6 hours PRN for mild pain ?-Continue Maalox 30 mg every 4 hrs PRN for indigestion ?-Continue Milk of Magnesia as needed every 6 hrs for constipation ?  ?Labs: CMP & CBC completed.  Toxicology screen +Amphetamines, +Opioids, +THC. EKG with QTC-465. Beta HCG  negative, ETOH <10. TSH, Lipid panel, Hemoglobin A1C, Vitamin B1 and Vitamin D levels ordered & pending. ? ?Discharge Planning: ?Social work and case management to assist with discharge planning and identificatio

## 2022-02-25 ENCOUNTER — Encounter (HOSPITAL_COMMUNITY): Payer: Self-pay

## 2022-02-25 DIAGNOSIS — F332 Major depressive disorder, recurrent severe without psychotic features: Secondary | ICD-10-CM

## 2022-02-25 DIAGNOSIS — M797 Fibromyalgia: Secondary | ICD-10-CM

## 2022-02-25 DIAGNOSIS — F411 Generalized anxiety disorder: Secondary | ICD-10-CM

## 2022-02-25 LAB — VITAMIN D 25 HYDROXY (VIT D DEFICIENCY, FRACTURES): Vit D, 25-Hydroxy: 57.65 ng/mL (ref 30–100)

## 2022-02-25 LAB — HEMOGLOBIN A1C
Hgb A1c MFr Bld: 5.1 % (ref 4.8–5.6)
Mean Plasma Glucose: 99.67 mg/dL

## 2022-02-25 MED ORDER — ARIPIPRAZOLE 10 MG PO TABS
10.0000 mg | ORAL_TABLET | Freq: Every day | ORAL | Status: DC
  Administered 2022-02-25: 10 mg via ORAL
  Filled 2022-02-25 (×3): qty 1

## 2022-02-25 MED ORDER — BACITRACIN-NEOMYCIN-POLYMYXIN OINTMENT TUBE: TOPICAL_OINTMENT | CUTANEOUS | Status: DC | PRN

## 2022-02-25 NOTE — BH IP Treatment Plan (Signed)
Interdisciplinary Treatment and Diagnostic Plan Update ? ?02/25/2022 ?Time of Session: 9:05am  ?Anita [name] ?MRN: [id number] ? ?Principal Diagnosis: Schizoaffective disorder ([place]) ? ?Secondary Diagnoses: Principal Problem: ?  Schizoaffective disorder ([place]) ?Active Problems: ?  GAD (generalized anxiety disorder) ?  Chronic pain syndrome ?  Fibromyalgia ?  Seasonal allergies ?  PTSD (post-traumatic stress disorder) ?  MDD (major depressive disorder) ? ? ?Current Medications:  ?Current Facility-Administered Medications  ?Medication Dose Route Frequency Provider Last Rate Last Admin  ? acetaminophen (TYLENOL) tablet 650 mg  650 mg Oral Q6H PRN Charmaine Downs C, NP   650 mg at 02/25/22 0809  ? albuterol (VENTOLIN HFA) 108 (90 Base) MCG/ACT inhaler 2 puff  2 puff Inhalation Q4H PRN Charmaine Downs C, NP      ? alum & mag hydroxide-simeth (MAALOX/MYLANTA) 200-200-20 MG/5ML suspension 30 mL  30 mL Oral Q4H PRN Delfin Gant, NP      ? ARIPiprazole (ABILIFY) tablet 5 mg  5 mg Oral QHS Nkwenti, Doris, NP   5 mg at 02/24/22 2112  ? busPIRone (BUSPAR) tablet 15 mg  15 mg Oral BID Charmaine Downs C, NP   15 mg at 02/25/22 2023  ? docusate sodium (COLACE) capsule 100 mg  100 mg Oral BID Charmaine Downs C, NP   100 mg at 02/24/22 3435  ? fluticasone (FLONASE) 50 MCG/ACT nasal spray 2 spray  2 spray Each Nare Daily Nicholes Rough, NP   2 spray at 02/25/22 6861  ? gabapentin (NEURONTIN) capsule 100 mg  100 mg Oral BID Nicholes Rough, NP   100 mg at 02/25/22 1308  ? gabapentin (NEURONTIN) capsule 400 mg  400 mg Oral QHS Nkwenti, Doris, NP      ? hydrOXYzine (ATARAX) tablet 25 mg  25 mg Oral TID PRN Ajibola, Ene A, NP   25 mg at 02/24/22 2112  ? loratadine (CLARITIN) tablet 10 mg  10 mg Oral Daily Nicholes Rough, NP   10 mg at 02/25/22 6837  ? magnesium hydroxide (MILK OF MAGNESIA) suspension 30 mL  30 mL Oral Daily PRN Charmaine Downs C, NP      ? nicotine (NICODERM CQ - dosed in mg/24 hours) patch 21 mg  21 mg  Transdermal Daily Hill, Jackie Plum, MD   21 mg at 02/25/22 0805  ? traZODone (DESYREL) tablet 50 mg  50 mg Oral QHS PRN Ajibola, Ene A, NP   50 mg at 02/24/22 2112  ? vitamin B-12 (CYANOCOBALAMIN) tablet 1,000 mcg  1,000 mcg Oral Daily Charmaine Downs C, NP   1,000 mcg at 02/25/22 2902  ? Vitamin D (Ergocalciferol) (DRISDOL) capsule 50,000 Units  50,000 Units Oral Weekly Charmaine Downs C, NP   50,000 Units at 02/23/22 2107  ? ?PTA Medications: ?Medications Prior to Admission  ?Medication Sig Dispense Refill Last Dose  ? busPIRone (BUSPAR) 30 MG tablet Take 1 tablet by mouth in the morning and at bedtime.     ? cetirizine (ZYRTEC) 10 MG tablet Take 10 mg by mouth daily.     ? FLUoxetine (PROZAC) 40 MG capsule Take 1 capsule by mouth daily.     ? fluticasone (FLONASE) 50 MCG/ACT nasal spray Place 2 sprays into both nostrils daily.     ? HYDROcodone-Chlorpheniramine (CHLORPHENIRAMINE-HYDROCODONE PO) Take 5 mLs by mouth every 12 (twelve) hours as needed.     ? loratadine (CLARITIN) 10 MG tablet Take 10 mg by mouth daily.     ? pregabalin (LYRICA) 25 MG capsule Take 25 mg  by mouth in the morning and at bedtime.     ? albuterol (VENTOLIN HFA) 108 (90 Base) MCG/ACT inhaler Inhale two puffs into the lungs every 4 (four) hours as needed for Wheezing or Shortness of Breath. (Patient taking differently: Inhale 2 puffs into the lungs every 4 (four) hours as needed for wheezing or shortness of breath.) 18 g 2   ? amphetamine-dextroamphetamine (ADDERALL) 20 MG tablet Take one tablet (20 mg dose) by mouth 2 (two) times daily. 60 tablet 0   ? EPINEPHrine 0.3 mg/0.3 mL IJ SOAJ injection Inject 0.3 mg into the muscle as needed for anaphylaxis. 1 each 0   ? predniSONE (DELTASONE) 20 MG tablet Take two tablets PO once daily with food for five days 10 tablet 0   ? vitamin B-12 (CYANOCOBALAMIN) 1000 MCG tablet Take one tablet (1,000 mcg dose) by mouth daily. (Patient taking differently: Take 1,000 mcg by mouth daily.) 130  tablet 3   ? Vitamin D, Ergocalciferol, (DRISDOL) 1.25 MG (50000 UNIT) CAPS capsule Take 1 capsule by mouth once a week. (Patient taking differently: Take 50,000 Units by mouth once a week.) 12 capsule 0   ? ? ?Patient Stressors: Marital or family conflict   ?Traumatic event   ? ?Patient Strengths: Ability for insight  ?Communication skills  ?Supportive family/friends  ? ?Treatment Modalities: Medication Management, Group therapy, Case management,  ?1 to 1 session with clinician, Psychoeducation, Recreational therapy. ? ? ?Physician Treatment Plan for Primary Diagnosis: Schizoaffective disorder ([place]) ?Long Term Goal(s): Improvement in symptoms so as ready for discharge  ? ?Short Term Goals: Ability to identify changes in lifestyle to reduce recurrence of condition will improve ?Ability to verbalize feelings will improve ?Ability to disclose and discuss suicidal ideas ?Ability to demonstrate self-control will improve ?Ability to identify and develop effective coping behaviors will improve ?Compliance with prescribed medications will improve ?Ability to identify triggers associated with substance abuse/mental health issues will improve ? ?Medication Management: Evaluate patient's response, side effects, and tolerance of medication regimen. ? ?Therapeutic Interventions: 1 to 1 sessions, Unit Group sessions and Medication administration. ? ?Evaluation of Outcomes: Not Met ? ?Physician Treatment Plan for Secondary Diagnosis: Principal Problem: ?  Schizoaffective disorder ([place]) ?Active Problems: ?  GAD (generalized anxiety disorder) ?  Chronic pain syndrome ?  Fibromyalgia ?  Seasonal allergies ?  PTSD (post-traumatic stress disorder) ?  MDD (major depressive disorder) ? ?Long Term Goal(s): Improvement in symptoms so as ready for discharge  ? ?Short Term Goals: Ability to identify changes in lifestyle to reduce recurrence of condition will improve ?Ability to verbalize feelings will improve ?Ability to disclose and  discuss suicidal ideas ?Ability to demonstrate self-control will improve ?Ability to identify and develop effective coping behaviors will improve ?Compliance with prescribed medications will improve ?Ability to identify triggers associated with substance abuse/mental health issues will improve    ? ?Medication Management: Evaluate patient's response, side effects, and tolerance of medication regimen. ? ?Therapeutic Interventions: 1 to 1 sessions, Unit Group sessions and Medication administration. ? ?Evaluation of Outcomes: Not Met ? ? ?RN Treatment Plan for Primary Diagnosis: Schizoaffective disorder ([place]) ?Long Term Goal(s): Knowledge of disease and therapeutic regimen to maintain health will improve ? ?Short Term Goals: Ability to remain free from injury will improve, Ability to participate in decision making will improve, Ability to verbalize feelings will improve, Ability to disclose and discuss suicidal ideas, and Ability to identify and develop effective coping behaviors will improve ? ?Medication Management: RN will administer medications as  ordered by provider, will assess and evaluate patient's response and provide education to patient for prescribed medication. RN will report any adverse and/or side effects to prescribing provider. ? ?Therapeutic Interventions: 1 on 1 counseling sessions, Psychoeducation, Medication administration, Evaluate responses to treatment, Monitor vital signs and CBGs as ordered, Perform/monitor CIWA, COWS, AIMS and Fall Risk screenings as ordered, Perform wound care treatments as ordered. ? ?Evaluation of Outcomes: Not Met ? ? ?LCSW Treatment Plan for Primary Diagnosis: Schizoaffective disorder ([place]) ?Long Term Goal(s): Safe transition to appropriate next level of care at discharge, Engage patient in therapeutic group addressing interpersonal concerns. ? ?Short Term Goals: Engage patient in aftercare planning with referrals and resources, Increase social support, Increase  emotional regulation, Facilitate acceptance of mental health diagnosis and concerns, Identify triggers associated with mental health/substance abuse issues, and Increase skills for wellness and recovery ? ?Therapeutic I

## 2022-02-25 NOTE — Progress Notes (Addendum)
Pt denied SI/HI/AVH today.  Pt says that she will continue to smoke THC, Delta 8 and 9 when she discharges from the hospital.  Says she has no intention of quitting.    Reports appetite is fair, good sleep quality last night.  Pt said, "my dad sabotages my life."  Pt assessed for needs and concerns and provided support. Pt will let staff know if pt needs assistance. ?

## 2022-02-25 NOTE — Group Note (Signed)
Occupational Therapy Group Note ? ?Group Topic:Communication  ?Group Date: 02/25/2022 ?Start Time: 1400 ?End Time: 1500 ?Facilitators: Brantley Stage, OT  ? ?Group Description: Group encouraged increased engagement and participation through discussion focused on communication styles. Patients were educated on the different styles of communication including passive, aggressive, assertive, and passive-aggressive communication. Group members shared and reflected on which styles they most often find themselves communicating in and brainstormed strategies on how to transition and practice a more assertive approach. Further discussion explored how to use assertiveness skills and strategies to further advocate and ask questions as it relates to their treatment plan and mental health.  ? ?Therapeutic Goal(s): ?Identify practical strategies to improve communication skills  ?Identify how to use assertive communication skills to address individual needs and wants ? ? ?Participation Level: Active and Engaged ?  ?Participation Quality: Independent ?  ?Behavior: Appropriate ?  ?Speech/Thought Process: Coherent, Directed, Focused, and Relevant ?  ?Affect/Mood: Appropriate ?  ?Insight: Good ?  ?Judgement: Good ?  ?Individualization: Pt was active and engaged in their participation of group discussion/activity. New communications skills were identified  ?Modes of Intervention: Discussion and Education  ?Patient Response to Interventions:  Attentive ?  ?Plan: Continue to engage patient in OT groups 2 - 3x/week. ? ?02/25/2022  ?Brantley Stage, OT ?Cornell Barman, OT ? ? ? ?

## 2022-02-25 NOTE — Progress Notes (Signed)
?   02/25/22 2000  ?Psych Admission Type (Psych Patients Only)  ?Admission Status Voluntary  ?Psychosocial Assessment  ?Patient Complaints Anxiety  ?Eye Contact Fair  ?Facial Expression Sad  ?Affect Anxious  ?Speech Logical/coherent  ?Interaction Assertive  ?Motor Activity Slow  ?Appearance/Hygiene Unremarkable  ?Behavior Characteristics Anxious  ?Mood Anxious  ?Aggressive Behavior  ?Effect No apparent injury  ?Thought Process  ?Coherency WDL  ?Content WDL  ?Delusions WDL  ?Perception WDL  ?Hallucination None reported or observed  ?Judgment WDL  ?Confusion None  ?Danger to Self  ?Current suicidal ideation? Denies  ?Danger to Others  ?Danger to Others None reported or observed  ? ? ?

## 2022-02-25 NOTE — Group Note (Signed)
Date:  02/25/2022 ?Time:  10:42 AM ? ?Group Topic/Focus:  ?Goals Group:   The focus of this group is to help patients establish daily goals to achieve during treatment and discuss how the patient can incorporate goal setting into their daily lives to aide in recovery. ?Orientation:   The focus of this group is to educate the patient on the purpose and policies of crisis stabilization and provide a format to answer questions about their admission.  The group details unit policies and expectations of patients while admitted. ? ? ? ?Participation Level:  Active ? ?Participation Quality:  Appropriate ? ?Affect:  Appropriate ? ?Cognitive:  Appropriate ? ?Insight: Appropriate ? ?Engagement in Group:  Engaged ? ?Modes of Intervention:  Discussion ? ?Additional Comments:  Pt wants to try to eat more. ? ?Jaquita Rector ?02/25/2022, 10:42 AM ? ?

## 2022-02-25 NOTE — Group Note (Signed)
Due to social work team being short staffed and high level of admissions, social work group unable to be held. ? ? ?Antiono Ettinger, LCSW, LCAS ?Clincal Social Worker  ?[facility] ?

## 2022-02-25 NOTE — Progress Notes (Signed)
[facility] MD Progress Note ? ?02/25/2022 3:45 PM ?Anita [name]  ?MRN:  [id number] ? ?Subjective:  Anita [name] reports: "I'm tired because I am not used to getting up this early, but I am okay." ? ?Reason For Admission:  Anita [name] is a 21 y.o Caucasian female who presented to the [facility] health urgent care [facility]) with worsening depressive symptoms, +suicidal thoughts & self injurious behaviors. She was deemed to be a danger to self and transferred  voluntarily to this [facility] for treatment and stabilization of her mood. ? ?Today's patient assessment: Pt presents today with a euthymic mood, affect is congruent and appropriate. Her attention to personal hygiene and grooming is fair, eye contact is good, speech is clear & coherent. Thought contents are organized with some illogical contents. She currently denies SI/HI/AH. She reports +VH, states she saw her deceased uncle yesterday who came to her in the form of a butterfly. She reports that this uncle was an alcoholic and completed a suicide in January of this year by shooting himself with a gun. Pt also presents today with paranoia, states that she feels as though her father is out to "sabotage my life. He is a narcissist." She made this claim, but is unable to provide a tangible reason as to why she feels this way.  ? ?Pt presents with poor insight regarding her substance abuse (THC). She gives rationales to support THC use, states she also uses Delta 8 and Delta 9 sometimes, but "the [facility] helps more than the CBD. It coats my nervous system. It helps me focus. It helps me complete tasks. I am able to get many things accomplished when I am high. I came from [place], so I know how it used and the many things that it does." Education provided about the dangers of THC use on her mental health. Pt states that she will continue to use THC even if medications for her mental health are working. Pt reports that she is tolerating taking the Abilify and denies  any medication related side effects. She also reports feeling less sedated during the day on the lower dose of the Gabapentin. ? ?She reports a good sleep quality last night, states that she was tired earlier because she is not used to getting up early. She states that at home, she wakes up at about 11 am. Pt showed Clinical research associate and her assigned RN superficial cuts to her right upper thigh, and states that she self inflicted them prior to this hospitalization.  Will place order for Neosporin to be applied to the area for infection prophylaxis as needed. Will increase Abilify to 10 mg nightly to manage mood and psychosis. ? ?Principal Problem: Schizoaffective disorder (HCC) ?Diagnosis: Principal Problem: ?  Schizoaffective disorder (HCC) ?Active Problems: ?  GAD (generalized anxiety disorder) ?  Chronic pain syndrome ?  Fibromyalgia ?  Seasonal allergies ?  PTSD (post-traumatic stress disorder) ?  MDD (major depressive disorder) ? ?Total Time spent with patient: 20 minutes ? ?Past Psychiatric History: As above ? ?Past Medical History:  ?Past Medical History:  ?Diagnosis Date  ? Anxiety   ? Asthma   ? age 18  ? Bipolar disorder (HCC)   ? Eczema   ? Migraines   ?  ?Past Surgical History:  ?Procedure Laterality Date  ? CHOLECYSTECTOMY N/A 01/03/2022  ? Procedure: LAPAROSCOPIC CHOLECYSTECTOMY WITH INTRAOPERATIVE CHOLANGIOGRAM;  Surgeon: Manus Rudd, MD;  Location: [facility] OR;  Service: General;  Laterality: N/A;  ? KNEE SURGERY  01/23/2015  ? L knee torn meniscus and ACL  ? TONSILLECTOMY AND ADENOIDECTOMY  2012  ? ?Family History:  ?Family History  ?Problem Relation Age of Onset  ? Alcohol abuse Mother   ? Depression Mother   ? Thyroid disease Mother   ? Heart attack Mother 1939  ?     died of heart attack  ? Bipolar disorder Mother   ? Asthma Mother   ? Vision loss Sister   ? Asthma Father   ?     as a child  ? Hypertension Maternal Grandfather   ? ?Family Psychiatric  History: As above ?Social History:  ?Social History   ? ?Substance and Sexual Activity  ?Alcohol Use Not Currently  ?   ?Social History  ? ?Substance and Sexual Activity  ?Drug Use Yes  ? Frequency: 7.0 times per week  ? Types: Marijuana  ?  ?Social History  ? ?Socioeconomic History  ? Marital status: Significant Other  ?  Spouse name: Not on file  ? Number of children: Not on file  ? Years of education: Not on file  ? Highest education level: Not on file  ?Occupational History  ? Occupation: student  ?Tobacco Use  ? Smoking status: Light Smoker  ?  Types: Cigarettes  ? Smokeless tobacco: Never  ?Vaping Use  ? Vaping Use: Former  ?Substance and Sexual Activity  ? Alcohol use: Not Currently  ? Drug use: Yes  ?  Frequency: 7.0 times per week  ?  Types: Marijuana  ? Sexual activity: Not Currently  ?  Comment: depo before  ?Other Topics Concern  ? Not on file  ?Social History Narrative  ? Lives with aunt (who has custody), cat and dog  ? ?Social Determinants of Health  ? ?Financial Resource Strain: Not on file  ?Food Insecurity: Not on file  ?Transportation Needs: Not on file  ?Physical Activity: Not on file  ?Stress: Not on file  ?Social Connections: Not on file  ? ?Additional Social History:  ?  ?  ? Sleep: Good ? ?Appetite:  Fair ? ?Current Medications: ?Current Facility-Administered Medications  ?Medication Dose Route Frequency Provider Last Rate Last Admin  ? acetaminophen (TYLENOL) tablet 650 mg  650 mg Oral Q6H PRN Dahlia Byesnuoha, Josephine C, NP   650 mg at 02/25/22 0809  ? albuterol (VENTOLIN HFA) 108 (90 Base) MCG/ACT inhaler 2 puff  2 puff Inhalation Q4H PRN Dahlia Byesnuoha, Josephine C, NP      ? alum & mag hydroxide-simeth (MAALOX/MYLANTA) 200-200-20 MG/5ML suspension 30 mL  30 mL Oral Q4H PRN Earney Navynuoha, Josephine C, NP      ? ARIPiprazole (ABILIFY) tablet 10 mg  10 mg Oral QHS Denessa Cavan, NP      ? busPIRone (BUSPAR) tablet 15 mg  15 mg Oral BID Dahlia Byesnuoha, Josephine C, NP   15 mg at 02/25/22 [id number]  ? docusate sodium (COLACE) capsule 100 mg  100 mg Oral BID Dahlia Byesnuoha, Josephine C,  NP   100 mg at 02/24/22 [id number]  ? fluticasone (FLONASE) 50 MCG/ACT nasal spray 2 spray  2 spray Each Nare Daily Starleen BlueNkwenti, Thurlow Gallaga, NP   2 spray at 02/25/22 [id number]  ? gabapentin (NEURONTIN) capsule 100 mg  100 mg Oral BID Starleen BlueNkwenti, Jacarie Pate, NP   100 mg at 02/25/22 1308  ? gabapentin (NEURONTIN) capsule 400 mg  400 mg Oral QHS Lamica Mccart, NP      ? hydrOXYzine (ATARAX) tablet 25 mg  25 mg Oral TID PRN Ajibola, Ene A, NP  25 mg at 02/24/22 2112  ? loratadine (CLARITIN) tablet 10 mg  10 mg Oral Daily Starleen Blue, NP   10 mg at 02/25/22 8502  ? magnesium hydroxide (MILK OF MAGNESIA) suspension 30 mL  30 mL Oral Daily PRN Dahlia Byes C, NP      ? nicotine (NICODERM CQ - dosed in mg/24 hours) patch 21 mg  21 mg Transdermal Daily Hill, Shelbie Hutching, MD   21 mg at 02/25/22 0805  ? traZODone (DESYREL) tablet 50 mg  50 mg Oral QHS PRN Ajibola, Ene A, NP   50 mg at 02/24/22 2112  ? vitamin B-12 (CYANOCOBALAMIN) tablet 1,000 mcg  1,000 mcg Oral Daily Dahlia Byes C, NP   1,000 mcg at 02/25/22 7741  ? Vitamin D (Ergocalciferol) (DRISDOL) capsule 50,000 Units  50,000 Units Oral Weekly Dahlia Byes C, NP   50,000 Units at 02/23/22 2107  ? ? ?Lab Results:  ?Results for orders placed or performed during the hospital encounter of 02/23/22 (from the past 48 hour(s))  ?Lipid panel     Status: Abnormal  ? Collection Time: 02/24/22  6:29 PM  ?Result Value Ref Range  ? Cholesterol 206 (H) 0 - 200 mg/dL  ? Triglycerides 88 <150 mg/dL  ? HDL 59 >40 mg/dL  ? Total CHOL/HDL Ratio 3.5 RATIO  ? VLDL 18 0 - 40 mg/dL  ? LDL Cholesterol 129 (H) 0 - 99 mg/dL  ?  Comment:        ?Total Cholesterol/HDL:CHD Risk ?Coronary Heart Disease Risk Table ?                    Men   Women ? 1/2 Average Risk   3.4   3.3 ? Average Risk       5.0   4.4 ? 2 X Average Risk   9.6   7.1 ? 3 X Average Risk  23.4   11.0 ?       ?Use the calculated Patient Ratio ?above and the CHD Risk Table ?to determine the patient's CHD Risk. ?       ?ATP III  CLASSIFICATION (LDL): ? <100     mg/dL   Optimal ? 287-867  mg/dL   Near or Above ?                   Optimal ? 130-159  mg/dL   Borderline ? 160-189  mg/dL   High ? >672     mg/dL   Very High ?Performed at [facility]

## 2022-02-25 NOTE — Group Note (Signed)
LCSW Group Therapy Note ? ? ?Group Date: 02/25/2022 ?Start Time: 1300 ?End Time: 1400 ? ?Type of Therapy and Topic:  Group Therapy - Healthy vs Unhealthy Coping Skills ? ?Participation Level:  Active  ? ?Description of Group ?The focus of this group was to determine what unhealthy coping techniques typically are used by group members and what healthy coping techniques would be helpful in coping with various problems. Patients were guided in becoming aware of the differences between healthy and unhealthy coping techniques. Patients were asked to identify 2-3 healthy coping skills they would like to learn to use more effectively. ? ?Therapeutic Goals ?Patients learned that coping is what human beings do all day long to deal with various situations in their lives ?Patients defined and discussed healthy vs unhealthy coping techniques ?Patients identified their preferred coping techniques and identified whether these were healthy or unhealthy ?Patients determined 2-3 healthy coping skills they would like to become more familiar with and use more often. ?Patients provided support and ideas to each other ? ? ?Summary of Patient Progress:  Due to the acuity and complex discharge plans, group was not held. Patient was provided therapeutic worksheets and asked to meet with CSW as needed. ? ? ?Therapeutic Modalities ?Cognitive Behavioral Therapy ?Motivational Interviewing ? ?Malik Paar M Zaleah Ternes, LCSWA ?02/25/2022  1:34 PM   ? ?

## 2022-02-25 NOTE — BH IP Treatment Plan (Addendum)
Interdisciplinary Treatment and Diagnostic Plan Update ? ?02/25/2022 ?Time of Session: 9:05am  ?Anita [name] ?MRN: [id number] ? ?Principal Diagnosis: Schizoaffective disorder ([place]) ? ?Secondary Diagnoses: Principal Problem: ?  Schizoaffective disorder ([place]) ?Active Problems: ?  GAD (generalized anxiety disorder) ?  Chronic pain syndrome ?  Fibromyalgia ?  Seasonal allergies ?  PTSD (post-traumatic stress disorder) ?  MDD (major depressive disorder) ? ? ?Current Medications:  ?Current Facility-Administered Medications  ?Medication Dose Route Frequency Provider Last Rate Last Admin  ? acetaminophen (TYLENOL) tablet 650 mg  650 mg Oral Q6H PRN Charmaine Downs C, NP   650 mg at 02/25/22 0809  ? albuterol (VENTOLIN HFA) 108 (90 Base) MCG/ACT inhaler 2 puff  2 puff Inhalation Q4H PRN Charmaine Downs C, NP      ? alum & mag hydroxide-simeth (MAALOX/MYLANTA) 200-200-20 MG/5ML suspension 30 mL  30 mL Oral Q4H PRN Delfin Gant, NP      ? ARIPiprazole (ABILIFY) tablet 5 mg  5 mg Oral QHS Nkwenti, Doris, NP   5 mg at 02/24/22 2112  ? busPIRone (BUSPAR) tablet 15 mg  15 mg Oral BID Charmaine Downs C, NP   15 mg at 02/25/22 1761  ? docusate sodium (COLACE) capsule 100 mg  100 mg Oral BID Charmaine Downs C, NP   100 mg at 02/24/22 6073  ? fluticasone (FLONASE) 50 MCG/ACT nasal spray 2 spray  2 spray Each Nare Daily Nicholes Rough, NP   2 spray at 02/25/22 7106  ? gabapentin (NEURONTIN) capsule 100 mg  100 mg Oral BID Nicholes Rough, NP   100 mg at 02/25/22 1308  ? gabapentin (NEURONTIN) capsule 400 mg  400 mg Oral QHS Nkwenti, Doris, NP      ? hydrOXYzine (ATARAX) tablet 25 mg  25 mg Oral TID PRN Ajibola, Ene A, NP   25 mg at 02/24/22 2112  ? loratadine (CLARITIN) tablet 10 mg  10 mg Oral Daily Nicholes Rough, NP   10 mg at 02/25/22 2694  ? magnesium hydroxide (MILK OF MAGNESIA) suspension 30 mL  30 mL Oral Daily PRN Charmaine Downs C, NP      ? nicotine (NICODERM CQ - dosed in mg/24 hours) patch 21 mg  21 mg  Transdermal Daily Hill, Jackie Plum, MD   21 mg at 02/25/22 0805  ? traZODone (DESYREL) tablet 50 mg  50 mg Oral QHS PRN Ajibola, Ene A, NP   50 mg at 02/24/22 2112  ? vitamin B-12 (CYANOCOBALAMIN) tablet 1,000 mcg  1,000 mcg Oral Daily Charmaine Downs C, NP   1,000 mcg at 02/25/22 8546  ? Vitamin D (Ergocalciferol) (DRISDOL) capsule 50,000 Units  50,000 Units Oral Weekly Charmaine Downs C, NP   50,000 Units at 02/23/22 2107  ? ?PTA Medications: ?Medications Prior to Admission  ?Medication Sig Dispense Refill Last Dose  ? busPIRone (BUSPAR) 30 MG tablet Take 1 tablet by mouth in the morning and at bedtime.     ? cetirizine (ZYRTEC) 10 MG tablet Take 10 mg by mouth daily.     ? FLUoxetine (PROZAC) 40 MG capsule Take 1 capsule by mouth daily.     ? fluticasone (FLONASE) 50 MCG/ACT nasal spray Place 2 sprays into both nostrils daily.     ? HYDROcodone-Chlorpheniramine (CHLORPHENIRAMINE-HYDROCODONE PO) Take 5 mLs by mouth every 12 (twelve) hours as needed.     ? loratadine (CLARITIN) 10 MG tablet Take 10 mg by mouth daily.     ? pregabalin (LYRICA) 25 MG capsule Take 25 mg  by mouth in the morning and at bedtime.     ? albuterol (VENTOLIN HFA) 108 (90 Base) MCG/ACT inhaler Inhale two puffs into the lungs every 4 (four) hours as needed for Wheezing or Shortness of Breath. (Patient taking differently: Inhale 2 puffs into the lungs every 4 (four) hours as needed for wheezing or shortness of breath.) 18 g 2   ? amphetamine-dextroamphetamine (ADDERALL) 20 MG tablet Take one tablet (20 mg dose) by mouth 2 (two) times daily. 60 tablet 0   ? EPINEPHrine 0.3 mg/0.3 mL IJ SOAJ injection Inject 0.3 mg into the muscle as needed for anaphylaxis. 1 each 0   ? predniSONE (DELTASONE) 20 MG tablet Take two tablets PO once daily with food for five days 10 tablet 0   ? vitamin B-12 (CYANOCOBALAMIN) 1000 MCG tablet Take one tablet (1,000 mcg dose) by mouth daily. (Patient taking differently: Take 1,000 mcg by mouth daily.) 130  tablet 3   ? Vitamin D, Ergocalciferol, (DRISDOL) 1.25 MG (50000 UNIT) CAPS capsule Take 1 capsule by mouth once a week. (Patient taking differently: Take 50,000 Units by mouth once a week.) 12 capsule 0   ? ? ?Patient Stressors: Marital or family conflict   ?Traumatic event   ? ?Patient Strengths: Ability for insight  ?Communication skills  ?Supportive family/friends  ? ?Treatment Modalities: Medication Management, Group therapy, Case management,  ?1 to 1 session with clinician, Psychoeducation, Recreational therapy. ? ? ?Physician Treatment Plan for Primary Diagnosis: Schizoaffective disorder ([place]) ?Long Term Goal(s): Improvement in symptoms so as ready for discharge  ? ?Short Term Goals: Ability to identify changes in lifestyle to reduce recurrence of condition will improve ?Ability to verbalize feelings will improve ?Ability to disclose and discuss suicidal ideas ?Ability to demonstrate self-control will improve ?Ability to identify and develop effective coping behaviors will improve ?Compliance with prescribed medications will improve ?Ability to identify triggers associated with substance abuse/mental health issues will improve ? ?Medication Management: Evaluate patient's response, side effects, and tolerance of medication regimen. ? ?Therapeutic Interventions: 1 to 1 sessions, Unit Group sessions and Medication administration. ? ?Evaluation of Outcomes: Not Met ? ?Physician Treatment Plan for Secondary Diagnosis: Principal Problem: ?  Schizoaffective disorder ([place]) ?Active Problems: ?  GAD (generalized anxiety disorder) ?  Chronic pain syndrome ?  Fibromyalgia ?  Seasonal allergies ?  PTSD (post-traumatic stress disorder) ?  MDD (major depressive disorder) ? ?Long Term Goal(s): Improvement in symptoms so as ready for discharge  ? ?Short Term Goals: Ability to identify changes in lifestyle to reduce recurrence of condition will improve ?Ability to verbalize feelings will improve ?Ability to disclose and  discuss suicidal ideas ?Ability to demonstrate self-control will improve ?Ability to identify and develop effective coping behaviors will improve ?Compliance with prescribed medications will improve ?Ability to identify triggers associated with substance abuse/mental health issues will improve    ? ?Medication Management: Evaluate patient's response, side effects, and tolerance of medication regimen. ? ?Therapeutic Interventions: 1 to 1 sessions, Unit Group sessions and Medication administration. ? ?Evaluation of Outcomes: Not Met ? ? ?RN Treatment Plan for Primary Diagnosis: Schizoaffective disorder ([place]) ?Long Term Goal(s): Knowledge of disease and therapeutic regimen to maintain health will improve ? ?Short Term Goals: Ability to remain free from injury will improve, Ability to participate in decision making will improve, Ability to verbalize feelings will improve, Ability to disclose and discuss suicidal ideas, and Ability to identify and develop effective coping behaviors will improve ? ?Medication Management: RN will administer medications as  ordered by provider, will assess and evaluate patient's response and provide education to patient for prescribed medication. RN will report any adverse and/or side effects to prescribing provider. ? ?Therapeutic Interventions: 1 on 1 counseling sessions, Psychoeducation, Medication administration, Evaluate responses to treatment, Monitor vital signs and CBGs as ordered, Perform/monitor CIWA, COWS, AIMS and Fall Risk screenings as ordered, Perform wound care treatments as ordered. ? ?Evaluation of Outcomes: Not Met ? ? ?LCSW Treatment Plan for Primary Diagnosis: Schizoaffective disorder ([place]) ?Long Term Goal(s): Safe transition to appropriate next level of care at discharge, Engage patient in therapeutic group addressing interpersonal concerns. ? ?Short Term Goals: Engage patient in aftercare planning with referrals and resources, Increase social support, Increase  emotional regulation, Facilitate acceptance of mental health diagnosis and concerns, Identify triggers associated with mental health/substance abuse issues, and Increase skills for wellness and recovery ? ?Therapeutic I

## 2022-02-25 NOTE — BHH Group Notes (Signed)
Spiritual care group on grief and loss facilitated by chaplain Dyanne Carrel, [facility]  ? ?Group Goal:  ? ?Support / Education around grief and loss  ? ?Members engage in facilitated group support and psycho-social education.  ? ?Group Description:  ? ?Following introductions and group rules, group members engaged in facilitated group dialog and support around topic of loss, with particular support around experiences of loss in their lives. Group Identified types of loss (relationships / self / things) and identified patterns, circumstances, and changes that precipitate losses. Reflected on thoughts / feelings around loss, normalized grief responses, and recognized variety in grief experience. Group noted Worden's four tasks of grief in discussion.  ? ?Group drew on Adlerian / Rogerian, narrative, MI,  ? ?Patient Progress: Anita [name] attended group and was engaged in group conversation.  She shared about the difficult dynamics in her family and how she grieved the kind of relationships she wished she had. ? ?[employer], [facility] ?Pager, [phone] ? ?

## 2022-02-25 NOTE — Progress Notes (Signed)
?   02/25/22 0500  ?Sleep  ?Number of Hours 6.25  ? ? ?

## 2022-02-26 MED ORDER — TRAZODONE HCL 100 MG PO TABS
100.0000 mg | ORAL_TABLET | Freq: Every day | ORAL | Status: DC
  Administered 2022-02-26 – 2022-02-27 (×2): 100 mg via ORAL
  Filled 2022-02-26 (×5): qty 1

## 2022-02-26 MED ORDER — ARIPIPRAZOLE 15 MG PO TABS
15.0000 mg | ORAL_TABLET | Freq: Every day | ORAL | Status: DC
  Administered 2022-02-27 – 2022-02-28 (×2): 15 mg via ORAL
  Filled 2022-02-26 (×5): qty 1

## 2022-02-26 MED ORDER — BUSPIRONE HCL 10 MG PO TABS
20.0000 mg | ORAL_TABLET | Freq: Two times a day (BID) | ORAL | Status: DC
  Administered 2022-02-26 – 2022-02-28 (×4): 20 mg via ORAL
  Filled 2022-02-26 (×8): qty 2

## 2022-02-26 MED ORDER — ARIPIPRAZOLE 5 MG PO TABS
5.0000 mg | ORAL_TABLET | Freq: Once | ORAL | Status: AC
  Administered 2022-02-26: 5 mg via ORAL
  Filled 2022-02-26 (×2): qty 1

## 2022-02-26 MED ORDER — BACITRACIN-NEOMYCIN-POLYMYXIN OINTMENT TUBE
TOPICAL_OINTMENT | Freq: Two times a day (BID) | CUTANEOUS | Status: DC
  Filled 2022-02-26: qty 14.17

## 2022-02-26 MED ORDER — BACITRACIN ZINC 500 UNIT/GM EX OINT
TOPICAL_OINTMENT | Freq: Two times a day (BID) | CUTANEOUS | Status: DC
  Filled 2022-02-26: qty 28.35

## 2022-02-26 NOTE — BHH Group Notes (Signed)
Adult Orientation Group Note ? ?Date:  02/26/2022 ?Time:  10:00 AM ? ?Group Topic/Focus:  ?Orientation:   The focus of this group is to educate the patient on the purpose and policies of crisis stabilization and provide a format to answer questions about their admission.  The group details unit policies and expectations of patients while admitted. ? ?Participation Level:  Active ? ?Participation Quality:  Appropriate ? ?Affect:  Appropriate ? ?Cognitive:  Appropriate ? ?Insight: Appropriate ? ?Engagement in Group:  Engaged ? ?Modes of Intervention:  Education ? ?Additional Comments:  Pt participated in group ? ?Anita [name] ?02/26/2022, 10:00 AM ?

## 2022-02-26 NOTE — Progress Notes (Signed)
Pt c/o of anxiety.  Denies SI/HI/AVH.  Pt socializing with peers in the dayroom.  Pt attending groups.RN administered medications per MD orders.  No side effects noted.  RN will monitor and provide support as needed. ?

## 2022-02-26 NOTE — Progress Notes (Signed)
Patient verbalized that she had a pretty good day since her aunt came in to visit. She rated her day as an 8 out of 10.  ?

## 2022-02-26 NOTE — Progress Notes (Addendum)
[facility] ? ?02/26/2022 1:34 PM ?Anita [name]  ?MRN:  [id number] ? ?Subjective:  Anita [name] reports: "I'm okay, but I am tired and my anxiety is bad." ? ?Reason For Admission:  Anita [name] is a 21 y.o Caucasian female who [place] [facility] with worsening depressive symptoms, +suicidal thoughts & self injurious behaviors. She was deemed to be a danger to self and [facility]  [facility] and [facility] of her mood. ? ?Today's patient assessment: Pt presents today with a euthymic mood, affect is congruent and appropriate. Her attention to personal hygiene and grooming is fair, eye contact is good, speech is clear & coherent. Thought contents are organized, but still with some illogical contents. She currently denies SI/HI/AH. She reports +VH, states she saw her deceased uncle 2 days ago in the form of a butterfly. Pt with paranoia, states that she believes that her father is out to hinder her progress, but is not able to substantiate this. ? ?Pt continues to present with poor insight regarding her substance abuse (THC). She continues to give rationales to support THC use, states she also uses Delta 8 and Delta 9 which "help me focus.". She states that she will continue using these substances after discharge, and has been educated on the dangers of using THC while taking her mental health medications.  ? ?Pt reports a poor sleep quality last night, states that she had trouble falling asleep, and staying asleep. She was given 50 mg Trazodone, but reports that it was ineffective. Will increase to 100 mg nightly for insomnia. Pt reports that "I hate Gabapentin. I don't want it  any more." This med will be discontinued as per pt's request. Buspar will be increased to 20 mg BID per pt's request for management of her anxiety. Neosporin ointment ordered for superficial cuts to right upper thigh. ? ?Principal Problem:  Schizoaffective disorder (HCC) ?Diagnosis: Principal Problem: ?  Schizoaffective disorder (HCC) ?Active Problems: ?  GAD (generalized anxiety disorder) ?  Chronic pain syndrome ?  Fibromyalgia ?  Seasonal allergies ?  PTSD (post-traumatic stress disorder) ?  MDD (major depressive disorder) ? ?Total Time spent with patient: 20 minutes ? ?Past Psychiatric History: As above ? ?Past Medical History:  ?Past Medical History:  ?Diagnosis Date  ? Anxiety   ? Asthma   ? age 89  ? Bipolar disorder (HCC)   ? Eczema   ? Migraines   ?  ?Past Surgical History:  ?Procedure Laterality Date  ? CHOLECYSTECTOMY N/A 01/03/2022  ? Procedure: LAPAROSCOPIC CHOLECYSTECTOMY WITH INTRAOPERATIVE CHOLANGIOGRAM;  Surgeon: Manus Ruddsuei, Matthew, MD;  [facility];  Service: General;  Laterality: N/A;  ? KNEE SURGERY  01/23/2015  ? L knee torn meniscus and ACL  ? TONSILLECTOMY AND ADENOIDECTOMY  2012  ? ?Family History:  ?Family History  ?Problem Relation Age of Onset  ? Alcohol abuse Mother   ? Depression Mother   ? Thyroid disease Mother   ? Heart attack Mother [id number]  ?     died of heart attack  ? Bipolar disorder Mother   ? Asthma Mother   ? Vision loss Sister   ? Asthma Father   ?     as a child  ? Hypertension Maternal Grandfather   ? ?Family Psychiatric  History: As above ?Social History:  ?Social History  ? ?Substance and Sexual Activity  ?Alcohol Use Not Currently  ?   ?Social History  ? ?  Substance and Sexual Activity  ?Drug Use Yes  ? Frequency: 7.0 times per week  ? Types: Marijuana  ?  ?Social History  ? ?Socioeconomic History  ? Marital status: Significant Other  ?  Spouse name: Not on file  ? Number of children: Not on file  ? Years of education: Not on file  ? Highest education level: Not on file  ?Occupational History  ? Occupation: student  ?Tobacco Use  ? Smoking status: Light Smoker  ?  Types: Cigarettes  ? Smokeless tobacco: Never  ?Vaping Use  ? Vaping Use: Former  ?Substance and Sexual Activity  ? Alcohol use: Not Currently  ? Drug  use: Yes  ?  Frequency: 7.0 times per week  ?  Types: Marijuana  ? Sexual activity: Not Currently  ?  Comment: depo before  ?Other Topics Concern  ? Not on file  ?Social History Narrative  ? Lives with aunt (who has custody), cat and dog  ? ?Social Determinants of Health  ? ?Financial Resource Strain: Not on file  ?Food Insecurity: Not on file  ?Transportation Needs: Not on file  ?Physical Activity: Not on file  ?Stress: Not on file  ?Social Connections: Not on file  ? ?Additional Social History:  ?  ?  ? Sleep: Good ? ?Appetite:  Fair ? ?Current Medications: ?Current Facility-Administered Medications  ?Medication Dose Route Frequency Provider Last Rate Last Admin  ? acetaminophen (TYLENOL) tablet 650 mg  650 mg Oral Q6H PRN Dahlia Byes C, NP   650 mg at 02/25/22 0809  ? albuterol (VENTOLIN HFA) 108 (90 Base) MCG/ACT inhaler 2 puff  2 puff Inhalation Q4H PRN Dahlia Byes C, NP      ? alum & mag hydroxide-simeth (MAALOX/MYLANTA) 200-200-20 MG/5ML suspension 30 mL  30 mL Oral Q4H PRN Dahlia Byes C, NP      ? [START ON 02/27/2022] ARIPiprazole (ABILIFY) tablet 15 mg  15 mg Oral Daily Starleen Blue, NP      ? ARIPiprazole (ABILIFY) tablet 5 mg  5 mg Oral Once Starleen Blue, NP      ? busPIRone (BUSPAR) tablet 20 mg  20 mg Oral BID Nkwenti, Doris, NP      ? docusate sodium (COLACE) capsule 100 mg  100 mg Oral BID Dahlia Byes C, NP   100 mg at 02/24/22 9735  ? fluticasone (FLONASE) 50 MCG/ACT nasal spray 2 spray  2 spray Each Nare Daily Starleen Blue, NP   2 spray at 02/26/22 3299  ? hydrOXYzine (ATARAX) tablet 25 mg  25 mg Oral TID PRN Ajibola, Ene A, NP   25 mg at 02/25/22 2126  ? loratadine (CLARITIN) tablet 10 mg  10 mg Oral Daily Starleen Blue, NP   10 mg at 02/26/22 0756  ? magnesium hydroxide (MILK OF MAGNESIA) suspension 30 mL  30 mL Oral Daily PRN Dahlia Byes C, NP      ? neomycin-bacitracin-polymyxin (NEOSPORIN) ointment   Topical PRN Starleen Blue, NP      ?  neomycin-bacitracin-polymyxin (NEOSPORIN) ointment   Topical BID Nkwenti, Doris, NP      ? nicotine (NICODERM CQ - dosed in mg/24 hours) patch 21 mg  21 mg Transdermal Daily Hill, Shelbie Hutching, MD   21 mg at 02/26/22 0756  ? traZODone (DESYREL) tablet 100 mg  100 mg Oral QHS Nkwenti, Doris, NP      ? vitamin B-12 (CYANOCOBALAMIN) tablet 1,000 mcg  1,000 mcg Oral Daily Onuoha, Josephine C, NP   1,000 mcg  at 02/26/22 0755  ? Vitamin D (Ergocalciferol) (DRISDOL) capsule 50,000 Units  50,000 Units Oral Weekly Dahlia Byes C, NP   50,000 Units at 02/23/22 2107  ? ? ?Lab Results:  ?Results for orders placed or performed during the hospital encounter of 02/23/22 (from the past 48 hour(s))  ?Lipid panel     Status: Abnormal  ? Collection Time: 02/24/22  6:29 PM  ?Result Value Ref Range  ? Cholesterol 206 (H) 0 - 200 mg/dL  ? Triglycerides 88 <150 mg/dL  ? HDL 59 >40 mg/dL  ? Total CHOL/HDL Ratio 3.5 RATIO  ? VLDL 18 0 - 40 mg/dL  ? LDL Cholesterol 129 (H) 0 - 99 mg/dL  ?  Comment:        ?Total Cholesterol/HDL:CHD Risk ?Coronary Heart Disease Risk Table ?                    Men   Women ? 1/2 Average Risk   3.4   3.3 ? Average Risk       5.0   4.4 ? 2 X Average Risk   9.6   7.1 ? 3 X Average Risk  23.4   11.0 ?       ?Use the calculated Patient Ratio ?above and the CHD Risk Table ?to determine the patient's CHD Risk. ?       ?ATP III CLASSIFICATION (LDL): ? <100     mg/dL   Optimal ? 814-481  mg/dL   Near or Above ?                   Optimal ? 130-159  mg/dL   Borderline ? 160-189  mg/dL   High ? >856     mg/dL   Very High ?Performed at [facility], [address] [place], [place] [address] ?  ?Hemoglobin A1c     Status: None  ? Collection Time: 02/24/22  6:29 PM  ?Result Value Ref Range  ? Hgb A1c MFr Bld 5.1 4.8 - 5.6 %  ?  Comment: (NOTE) ?Pre diabetes:          5.7%-6.4% ? ?Diabetes:              >6.4% ? ?Glycemic control for   <7.0% ?adults with diabetes ?  ? Mean Plasma Glucose 99.67 mg/dL   ?  Comment: Performed at [facility], [address] [place], [place] [address]  ?TSH     Status: None  ? Collection Time: 02/24/22  6:29 PM  ?Result Value Ref Range  ? TSH 0.589 0.350 - 4.500 uIU/mL  ?  Comment: Performed

## 2022-02-26 NOTE — Progress Notes (Signed)
?   02/26/22 [id number]  ?Sleep  ?Number of Hours 7.5  ? ? ?

## 2022-02-26 NOTE — B[facility] Suicide Risk Assessment (Signed)
[facility] INPATIENT:  Family/Significant Other Suicide Prevention Education ? ?Suicide Prevention Education:  ?Education Completed; Regenia Skeeter [phone] Midwife) has been identified by the patient as the family member/significant other with whom the patient will be residing, and identified as the person(s) who will aid the patient in the event of a mental health crisis (suicidal ideations/suicide attempt).  With written consent from the patient, the family member/significant other has been provided the following suicide prevention education, prior to the and/or following the discharge of the patient. ? ?The suicide prevention education provided includes the following: ?Suicide risk factors ?Suicide prevention and interventions ?National Suicide Hotline telephone number ?[facility] assessment telephone number ?[facility] Emergency Assistance 911 ?[place] and/or Residential Mobile Crisis Unit telephone number ? ?Request made of family/significant other to: ?Remove weapons (e.g., guns, rifles, knives), all items previously/currently identified as safety concern.   ?Remove drugs/medications (over-the-counter, prescriptions, illicit drugs), all items previously/currently identified as a safety concern. ? ?The family member/significant other verbalizes understanding of the suicide prevention education information provided.  The family member/significant other agrees to remove the items of safety concern listed above. ? ?CSW spoke with Mrs. Foust who states that her Niece recently went through a break-up with their girlfriend.  She states that her Niece has a lot of past trauma and has not been able to handle the recent break-up.  She states that her Niece takes all of her medications correctly and attended all of her scheduled appointments.  She states that her Niece came to her when she began having increased suicidal thoughts.  Mrs. Foust confirms that her Niece lives with her and can return to  the home after discharge.  She states that there are no firearms or weapons in the home.  CSW completed SPE with Mrs. Foust.  ? ?Aram Beecham ?02/26/2022, 11:24 AM ?

## 2022-02-27 LAB — VITAMIN B1: Vitamin B1 (Thiamine): 134.2 nmol/L (ref 66.5–200.0)

## 2022-02-27 NOTE — Progress Notes (Signed)
[facility] MD Progress Note ? ?02/27/2022 12:27 PM ?Anita [name]  ?MRN:  [id number] ? ?Subjective:  Anita [name] reports: "I am a little tired, but my mood is pretty good." ? ?Reason For Admission:  Anita [name] is a 21 y.o Caucasian female who presented to the [employer] health urgent care [facility]) with worsening depressive symptoms, +suicidal thoughts & self injurious behaviors. She was deemed to be a danger to self and transferred  voluntarily to this [facility] for treatment and stabilization of her mood. ? ?Today's patient assessment: Pt presents today with a euthymic mood, affect is congruent and appropriate. Her attention to personal hygiene and grooming is fair, eye contact is good, speech is clear & coherent. Thought contents are organized, but she continues to state that she is able to communicate with the dead, and feels as though her father is out to sabotage her life and that he has always been this way. She reports having a three page letter that she wrote as proof of why she doesn't like her father. Consent obtained from pt and her aunt with who she lives called to ascertain if the spiritual believes and the paranoia are fixed believes and her baseline. Anita [name] (pt's aunt) called at [phone]. Pt's aunt states that pt's believe in being able to communicate with the dead is fixed, and it is something that she has believed in for a long time. Pt's aunt also states that pt's father "is not a nice person", and that is why pt does not like him or feel supported by him. Aunt verbalized willingness to pick pt up from the hospital tomorrow after discharge, and states that she is ready for pt to come home.  ? ?Pt continues to present with poor insight regarding her substance abuse (THC). She continues to give rationales to support THC use, states she also uses Delta 8 and Delta 9 which "help me focus.". She states that she will continue using these substances after discharge, and has been  educated on the dangers of using THC while taking her mental health medications. Pt has also been educated about the fact that her WBCs are slightly elevated, cholesterol is elevated, and the need to follow these up with her Primary care provider after discharge. ? ?Pt reports a good sleep quality last night, and reports a fair appetite. . She reports that the change in her medications has been helpful, and reports that the increase in the dose of her Buspar to 20 mg BID has been helpful in eliminating her anxiety. Will continue current medications as listed below. ? ?Principal Problem: Schizoaffective disorder ([place]) ?Diagnosis: Principal Problem: ?  Schizoaffective disorder ([place]) ?Active Problems: ?  GAD (generalized anxiety disorder) ?  Chronic pain syndrome ?  Fibromyalgia ?  Seasonal allergies ?  PTSD (post-traumatic stress disorder) ?  MDD (major depressive disorder) ? ?Total Time spent with patient: 20 minutes ? ?Past Psychiatric History: As above ? ?Past Medical History:  ?Past Medical History:  ?Diagnosis Date  ? Anxiety   ? Asthma   ? age 64  ? Bipolar disorder ([place])   ? Eczema   ? Migraines   ?  ?Past Surgical History:  ?Procedure Laterality Date  ? CHOLECYSTECTOMY N/A 01/03/2022  ? Procedure: LAPAROSCOPIC CHOLECYSTECTOMY WITH INTRAOPERATIVE CHOLANGIOGRAM;  Surgeon: Donnie Mesa, MD;  Location: [place];  Service: General;  Laterality: N/A;  ? KNEE SURGERY  01/23/2015  ? L knee torn meniscus and ACL  ? TONSILLECTOMY AND ADENOIDECTOMY  2012  ? ?Family History:  ?Family History  ?Problem Relation Age of Onset  ? Alcohol abuse Mother   ? Depression Mother   ? Thyroid disease Mother   ? Heart attack Mother 84  ?     died of heart attack  ? Bipolar disorder Mother   ? Asthma Mother   ? Vision loss Sister   ? Asthma Father   ?     as a child  ? Hypertension Maternal Grandfather   ? ?Family Psychiatric  History: As above ?Social History:  ?Social History  ? ?Substance and Sexual Activity  ?Alcohol Use Not Currently   ?   ?Social History  ? ?Substance and Sexual Activity  ?Drug Use Yes  ? Frequency: 7.0 times per week  ? Types: Marijuana  ?  ?Social History  ? ?Socioeconomic History  ? Marital status: Significant Other  ?  Spouse name: Not on file  ? Number of children: Not on file  ? Years of education: Not on file  ? Highest education level: Not on file  ?Occupational History  ? Occupation: student  ?Tobacco Use  ? Smoking status: Light Smoker  ?  Types: Cigarettes  ? Smokeless tobacco: Never  ?Vaping Use  ? Vaping Use: Former  ?Substance and Sexual Activity  ? Alcohol use: Not Currently  ? Drug use: Yes  ?  Frequency: 7.0 times per week  ?  Types: Marijuana  ? Sexual activity: Not Currently  ?  Comment: depo before  ?Other Topics Concern  ? Not on file  ?Social History Narrative  ? Lives with aunt (who has custody), cat and dog  ? ?Social Determinants of Health  ? ?Financial Resource Strain: Not on file  ?Food Insecurity: Not on file  ?Transportation Needs: Not on file  ?Physical Activity: Not on file  ?Stress: Not on file  ?Social Connections: Not on file  ? ?Additional Social History:  ?  ?  ? Sleep: Good ? ?Appetite:  Fair ? ?Current Medications: ?Current Facility-Administered Medications  ?Medication Dose Route Frequency Provider Last Rate Last Admin  ? acetaminophen (TYLENOL) tablet 650 mg  650 mg Oral Q6H PRN Charmaine Downs C, NP   650 mg at 02/25/22 0809  ? albuterol (VENTOLIN HFA) 108 (90 Base) MCG/ACT inhaler 2 puff  2 puff Inhalation Q4H PRN Charmaine Downs C, NP      ? alum & mag hydroxide-simeth (MAALOX/MYLANTA) 200-200-20 MG/5ML suspension 30 mL  30 mL Oral Q4H PRN Delfin Gant, NP      ? ARIPiprazole (ABILIFY) tablet 15 mg  15 mg Oral Daily Nicholes Rough, NP   15 mg at 02/27/22 [id number]  ? busPIRone (BUSPAR) tablet 20 mg  20 mg Oral BID Nicholes Rough, NP   20 mg at 02/27/22 0806  ? docusate sodium (COLACE) capsule 100 mg  100 mg Oral BID Charmaine Downs C, NP   100 mg at 02/27/22 [id number]  ? fluticasone  (FLONASE) 50 MCG/ACT nasal spray 2 spray  2 spray Each Nare Daily Nicholes Rough, NP   2 spray at 02/27/22 [id number]  ? hydrOXYzine (ATARAX) tablet 25 mg  25 mg Oral TID PRN Ajibola, Ene A, NP   25 mg at 02/26/22 2117  ? loratadine (CLARITIN) tablet 10 mg  10 mg Oral Daily Nicholes Rough, NP   10 mg at 02/27/22 [id number]  ? magnesium hydroxide (MILK OF MAGNESIA) suspension 30 mL  30 mL Oral Daily PRN Delfin Gant, NP      ?  neomycin-bacitracin-polymyxin (NEOSPORIN) ointment   Topical BID Nicholes Rough, NP   Given at 02/27/22 [id number]  ? nicotine (NICODERM CQ - dosed in mg/24 hours) patch 21 mg  21 mg Transdermal Daily Hill, Jackie Plum, MD   21 mg at 02/27/22 [id number]  ? traZODone (DESYREL) tablet 100 mg  100 mg Oral QHS Zahraa Bhargava, NP   100 mg at 02/26/22 2117  ? vitamin B-12 (CYANOCOBALAMIN) tablet 1,000 mcg  1,000 mcg Oral Daily Charmaine Downs C, NP   1,000 mcg at 02/27/22 [id number]  ? Vitamin D (Ergocalciferol) (DRISDOL) capsule 50,000 Units  50,000 Units Oral Weekly Charmaine Downs C, NP   50,000 Units at 02/23/22 2107  ? ? ?Lab Results:  ?No results found for this or any previous visit (from the past 48 hour(s)). ? ? ?Blood Alcohol level:  ?Lab Results  ?Component Value Date  ? ETH <10 02/22/2022  ? ? ?Metabolic Disorder Labs: ?Lab Results  ?Component Value Date  ? HGBA1C 5.1 02/24/2022  ? MPG 99.67 02/24/2022  ? ?No results found for: PROLACTIN ?Lab Results  ?Component Value Date  ? CHOL 206 (H) 02/24/2022  ? TRIG 88 02/24/2022  ? HDL 59 02/24/2022  ? CHOLHDL 3.5 02/24/2022  ? VLDL 18 02/24/2022  ? LDLCALC 129 (H) 02/24/2022  ? ? ?Physical Findings: ?AIMS: 0 ?CIWA:  n/a  ?COWS:  n/a ? ?Musculoskeletal: ?Strength & Muscle Tone: within normal limits ?Gait & Station: normal ?Patient leans: N/A ? ?Psychiatric Specialty Exam: ? ?Presentation  ?General Appearance: Appropriate for Environment; Fairly Groomed ? ?Eye Contact:Good ? ?Speech:Clear and Coherent ? ?Speech Volume:Normal ? ?Handedness:Right ? ?Mood and Affect   ?Mood:Euthymic ? ?Affect:Congruent ? ?Thought Process  ?Thought Processes:Coherent ? ?Descriptions of Associations:Intact ? ?Orientation:Full (Time, Place and Person) ? ?Thought Content:Believes in spir

## 2022-02-27 NOTE — Progress Notes (Signed)
?   02/26/22 2100  ?Psych Admission Type (Psych Patients Only)  ?Admission Status Voluntary  ?Psychosocial Assessment  ?Patient Complaints Anxiety  ?Eye Contact Glaring  ?Facial Expression Anxious;Animated  ?Affect Appropriate to circumstance  ?Speech Logical/coherent  ?Interaction Assertive  ?Motor Activity Slow  ?Appearance/Hygiene Improved  ?Behavior Characteristics Appropriate to situation  ?Mood Pleasant  ?Thought Process  ?Coherency WDL  ?Content WDL  ?Delusions None reported or observed  ?Perception WDL  ?Hallucination None reported or observed  ?Judgment Poor  ?Confusion None  ?Danger to Self  ?Current suicidal ideation? Denies  ?Self-Injurious Behavior No self-injurious ideation or behavior indicators observed or expressed   ?Agreement Not to Harm Self Yes  ?Description of Agreement Verbal  ?Danger to Others  ?Danger to Others None reported or observed  ? ? ?

## 2022-02-27 NOTE — Progress Notes (Signed)
Pt stated she was ready for D/C ? ? ? 02/27/22 2200  ?Psych Admission Type (Psych Patients Only)  ?Admission Status Voluntary  ?Psychosocial Assessment  ?Patient Complaints Anxiety  ?Eye Contact Fair  ?Facial Expression Anxious  ?Affect Appropriate to circumstance  ?Speech Logical/coherent  ?Interaction Assertive  ?Motor Activity Slow  ?Appearance/Hygiene Improved  ?Behavior Characteristics Cooperative  ?Mood Anxious  ?Aggressive Behavior  ?Effect No apparent injury  ?Thought Process  ?Coherency WDL  ?Content WDL  ?Delusions WDL  ?Perception WDL  ?Hallucination None reported or observed  ?Judgment Poor  ?Confusion None  ?Danger to Self  ?Current suicidal ideation? Denies  ?Danger to Others  ?Danger to Others None reported or observed  ? ? ?

## 2022-02-27 NOTE — Group Note (Signed)
LCSW Group Therapy Note ? ? ?Group Date: 02/27/2022 ?Start Time: 1300 ?End Time: 1400 ? ? ?Type of Therapy and Topic:  Group Therapy: Boundaries ? ?Participation Level:  Active ? ?Description of Group: ?This group will address the use of boundaries in their personal lives. Patients will explore why boundaries are important, the difference between healthy and unhealthy boundaries, and negative and postive outcomes of different boundaries and will look at how boundaries can be crossed.  Patients will be encouraged to identify current boundaries in their own lives and identify what kind of boundary is being set. Facilitators will guide patients in utilizing problem-solving interventions to address and correct types boundaries being used and to address when no boundary is being used. Understanding and applying boundaries will be explored and addressed for obtaining and maintaining a balanced life. Patients will be encouraged to explore ways to assertively make their boundaries and needs known to significant others in their lives, using other group members and facilitator for role play, support, and feedback. ? ?Therapeutic Goals: ? ?1.  Patient will identify areas in their life where setting clear boundaries could be  used to improve their life.  ?2.  Patient will identify signs/triggers that a boundary is not being respected. ?3.  Patient will identify two ways to set boundaries in order to achieve balance in  their lives: ?4.  Patient will demonstrate ability to communicate their needs and set boundaries  through discussion and/or role plays ? ?Summary of Patient Progress:  Anita [name] was present/active throughout the session and proved open to feedback from CSW and peers. Patient demonstrated good insight into the subject matter, was respectful of peers, and was present throughout the entire session. ? ?Therapeutic Modalities:   ?Cognitive Behavioral Therapy ?Solution-Focused Therapy ? ?Anita Mcfall E Camaryn Lumbert, LCSW ?02/27/2022   2:04 PM   ? ?

## 2022-02-27 NOTE — Group Note (Signed)
Recreation Therapy Group Note ? ? ?Group Topic:Stress Management  ?Group Date: 02/27/2022 ?Start Time: 0930 ?End Time: 8978 ?Facilitators: Caroll Rancher, LRT,CTRS ?Location: [address] ? ? ?Goal Area(s) Addresses:  ?Patient will identify positive stress management techniques. ?Patient will identify benefits of using stress management post d/c. ? ?Group Description:  Meditation.  LRT played a meditation that focused on finding and building morning energy for the day.  Patients were to follow along and the meditation led patients to focus on their breathing and channel energy to each chakra point in the body.  ? ? ?Affect/Mood: Appropriate ?  ?Participation Level: Active ?  ?Participation Quality: Independent ?  ?Behavior: Appropriate ?  ?Speech/Thought Process: Focused ?  ?Insight: Good ?  ?Judgement: Good ?  ?Modes of Intervention: Meditation ?  ?Patient Response to Interventions:  Engaged ?  ?Education Outcome: ? Acknowledges education and In group clarification offered   ? ?Clinical Observations/Individualized Feedback: Pt attended and participated in group session.  ?  ? ?Plan: Continue to engage patient in RT group sessions 2-3x/week. ? ? ?Caroll Rancher, LRT,CTRS ?02/27/2022 12:46 PM ?

## 2022-02-27 NOTE — Progress Notes (Signed)
Patient did attend the evening speaker NA meeting. Pt left before group was over but stayed for the majority of the meeting.  ? ?

## 2022-02-27 NOTE — Progress Notes (Signed)
Pt reports feeling uneasy that another patient is looking her up and down. Pt reports behavior is increasing. This Clinical research associate has not witnessed this behavior. Pt was told that safety will be maintained, a note will be written about her concerns, and that the nurse will be notified. Pt states, " I'm not going to talk to him and I'm leaving tomorrow any ways".  ?

## 2022-02-28 DIAGNOSIS — F1210 Cannabis abuse, uncomplicated: Secondary | ICD-10-CM | POA: Diagnosis present

## 2022-02-28 DIAGNOSIS — F251 Schizoaffective disorder, depressive type: Secondary | ICD-10-CM

## 2022-02-28 MED ORDER — VITAMIN D (ERGOCALCIFEROL) 1.25 MG (50000 UNIT) PO CAPS: 50000.0000 [IU] | ORAL_CAPSULE | ORAL | 0 refills | Status: AC

## 2022-02-28 MED ORDER — DOCUSATE SODIUM 100 MG PO CAPS: 100.0000 mg | ORAL_CAPSULE | Freq: Two times a day (BID) | ORAL | 0 refills | Status: DC

## 2022-02-28 MED ORDER — LORATADINE 10 MG PO TABS: 10.0000 mg | ORAL_TABLET | Freq: Every day | ORAL | Status: AC

## 2022-02-28 MED ORDER — HYDROXYZINE HCL 25 MG PO TABS: 25.0000 mg | ORAL_TABLET | Freq: Four times a day (QID) | ORAL | 0 refills | Status: AC | PRN

## 2022-02-28 MED ORDER — ARIPIPRAZOLE 15 MG PO TABS: 15.0000 mg | ORAL_TABLET | Freq: Every day | ORAL | 0 refills | Status: AC

## 2022-02-28 MED ORDER — CYANOCOBALAMIN 1000 MCG PO TABS: 1000.0000 ug | ORAL_TABLET | Freq: Every day | ORAL | 0 refills | Status: AC

## 2022-02-28 MED ORDER — NICOTINE 21 MG/24HR TD PT24: 21.0000 mg | MEDICATED_PATCH | Freq: Every day | TRANSDERMAL | 0 refills | Status: DC

## 2022-02-28 MED ORDER — TRAZODONE HCL 100 MG PO TABS: 100.0000 mg | ORAL_TABLET | Freq: Every day | ORAL | 0 refills | Status: AC

## 2022-02-28 MED ORDER — BUSPIRONE HCL 10 MG PO TABS: 20.0000 mg | ORAL_TABLET | Freq: Two times a day (BID) | ORAL | 0 refills | Status: AC

## 2022-02-28 NOTE — Progress Notes (Signed)
?   02/28/22 0515  ?Sleep  ?Number of Hours 6.5  ? ? ?

## 2022-02-28 NOTE — Progress Notes (Signed)
?   02/28/22 0900  ?Psych Admission Type (Psych Patients Only)  ?Admission Status Voluntary  ?Psychosocial Assessment  ?Patient Complaints None  ?Eye Contact Fair  ?Facial Expression Anxious  ?Affect Appropriate to circumstance  ?Speech Logical/coherent  ?Interaction Assertive  ?Motor Activity Slow  ?Appearance/Hygiene Improved  ?Behavior Characteristics Cooperative  ?Mood Anxious  ?Aggressive Behavior  ?Effect No apparent injury  ?Thought Process  ?Coherency WDL  ?Content WDL  ?Delusions WDL  ?Perception WDL  ?Hallucination None reported or observed  ?Judgment Poor  ?Confusion None  ?Danger to Self  ?Current suicidal ideation? Denies  ?Danger to Others  ?Danger to Others None reported or observed  ? ? ?

## 2022-02-28 NOTE — Progress Notes (Signed)
Patient discharged. Discharge instructions reviewed with patient. Patient verbalized understanding. Patient received all personal belongings. At discharge patient denied SI/HI/AVH. Patient left unit at 1223.  ?

## 2022-02-28 NOTE — [facility] Suicide Risk Assessment (Signed)
Suicide Risk Assessment ? ?Discharge Assessment    ?[facility] Discharge Suicide Risk Assessment ? ? ?Principal Problem: Schizoaffective disorder (HCC) ?Discharge Diagnoses: Principal Problem: ?  Schizoaffective disorder (HCC) ?Active Problems: ?  GAD (generalized anxiety disorder) ?  Chronic pain syndrome ?  Fibromyalgia ?  Seasonal allergies ?  PTSD (post-traumatic stress disorder) ?  MDD (major depressive disorder) ? ?Reason For Admission:  Anita [name] is a 21 y.o Caucasian female who presented to the [employer] health urgent care [facility]) with worsening depressive symptoms, +suicidal thoughts & self injurious behaviors. She was deemed to be a danger to self and transferred  voluntarily to this [facility] for treatment and stabilization of her mood. ? ?                                   HOSPITAL COURSE: ?During the patient's hospitalization, patient had extensive initial psychiatric evaluation, and follow-up psychiatric evaluations every day.  Diagnoses provided & medications started upon initial assessment are as follows:  ?-Discontinued home Prozac d/t complaints of ineffectiveness ?-Started Abilify 5 mg nightly for mood stabilization & Psychosis ?-Changed Gabapentin to 100 mg in AM, 100 mg in the afternoon & 400 mg  nightly for chronic pain (complaints of sedation while on 400 mg TID) ?-Started Claritin daily for seasonal allergies ?-Started Flonase daily for Seasonal allergies ?-Started Colace 100 mg daily for constipation ?-Started Buspar 15 mg BID for anxiety ?-Started Hydroxyzine 25 mg every 6 hours PRN ?-Started Albuterol inhaler PRN for SOB/Wheezing ?-Started Drisdol 50,000 units weekly ?-Started Vitamin B-12 1,000 mcg daily ? ?During the hospitalization, other adjustments were made to the patient's medication regimen, with medications at discharge being as follows: ? ?-Continue 15 mg nightly for mood stabilization & Psychosis ?-Discontinued Gabapentin per pt's request ?-Continue Claritin daily  for seasonal allergies ?-Continue Flonase daily for Seasonal allergies ?-Continue Colace 100 mg daily for constipation ?-Continue Buspar 20 mg BID for anxiety ?-Continue Hydroxyzine 25 mg every 6 hours PRN ?-Continue Albuterol inhaler PRN for SOB/Wheezing ?-Continue Drisdol 50,000 units weekly ?-Continue Vitamin B-12 1,000 mcg daily ?The patient denies having side effects to prescribed psychiatric medication. ?Patient's care was discussed during the interdisciplinary team meeting every day during the hospitalization. Gradually, patient started adjusting to milieu. The patient was evaluated each day by a clinical provider to ascertain response to treatment. Improvement was noted by the patient's report of decreasing symptoms, improved sleep and appetite, affect, medication tolerance, behavior, and participation in unit programming.  Patient was asked each day to complete a self inventory noting mood, mental status, pain, new symptoms, anxiety and concerns. Symptoms were reported as significantly decreased or resolved completely by discharge.  ?On day of discharge, the patient reports that their mood is stable. The patient denies having suicidal thoughts for more than 48 hours prior to discharge.  Patient denies having homicidal thoughts.  Patient denies having auditory hallucinations.  Patient denies any visual hallucinations or other symptoms of psychosis. The patient was motivated to continue taking medication with a goal of continued improvement in mental health.  ? ?The patient reports that their target psychiatric symptoms of depression & psychosis responded well to the psychiatric medications, and the patient reports overall benefit from this psychiatric hospitalization. Supportive psychotherapy was provided to the patient. The patient also participated in regular group therapy while hospitalized. Coping skills, problem solving as well as relaxation therapies were also part of the unit  programming. ? ?Total  Time spent with patient: 30 minutes ? ?Musculoskeletal: ?Strength & Muscle Tone: within normal limits ?Gait & Station: normal ?Patient leans: N/A ? ?Psychiatric Specialty Exam ? ?Presentation  ?General Appearance: Appropriate for Environment; Fairly Groomed ? ?Eye Contact:Good ? ?Speech:Clear and Coherent ? ?Speech Volume:Normal ? ?Handedness:Right ? ?Mood and Affect  ?Mood:Euthymic ? ?Duration of Depression Symptoms: Greater than two weeks ? ?Affect:Appropriate; Congruent ? ?Thought Process  ?Thought Processes:Coherent ? ?Descriptions of Associations:Intact ? ?Orientation:Full (Time, Place and Person) ? ?Thought Content:Logical ? ?History of Schizophrenia/Schizoaffective disorder:No ? ?Duration of Psychotic Symptoms:No data recorded ?Hallucinations:Hallucinations: None ? ?Ideas of Reference:None ? ?Suicidal Thoughts:Suicidal Thoughts: No ? ?Homicidal Thoughts:Homicidal Thoughts: No ? ?Sensorium  ?Memory:Immediate Good ? ?Judgment:Good ? ?Insight:Fair ? ?Executive Functions  ?Concentration:Good ? ?Attention Span:Good ? ?Recall:Good ? ?Fund of Knowledge:Good ? ?Language:Good ? ?Psychomotor Activity  ?Psychomotor Activity:Psychomotor Activity: Normal ? ?Assets  ?Assets:Communication Skills; Housing; Social Support ? ?Sleep  ?Sleep:Sleep: Good ? ?Physical Exam: ?Physical Exam ?Constitutional:   ?   Appearance: Normal appearance.  ?HENT:  ?   Head: Normocephalic.  ?   Nose: No congestion or rhinorrhea.  ?Eyes:  ?   Pupils: Pupils are equal, round, and reactive to light.  ?Pulmonary:  ?   Effort: Pulmonary effort is normal.  ?Musculoskeletal:     ?   General: Normal range of motion.  ?   Cervical back: Normal range of motion.  ?Neurological:  ?   Mental Status: She is alert and oriented to person, place, and time.  ?Psychiatric:     ?   Behavior: Behavior normal.  ? ?Review of Systems  ?Constitutional: Negative.   ?HENT: Negative.    ?Eyes: Negative.   ?Respiratory: Negative.    ?Cardiovascular: Negative.    ?Gastrointestinal: Negative.   ?Genitourinary: Negative.   ?Musculoskeletal: Negative.   ?Skin: Negative.   ?Neurological: Negative.   ?Psychiatric/Behavioral:  Positive for depression (Pt reports that their depressive symptoms are resolving, and currently denies SI/HI/AVH. She is verbally contracting for safety outside of this [facility]) and substance abuse (THC abuse-educated on dangers of using as depressive symptoms might worsen). Negative for hallucinations, memory loss and suicidal ideas. The patient is not nervous/anxious and does not have insomnia.   ?Blood pressure (!) 104/53, pulse 99, temperature 98.2 ?F (36.8 ?C), temperature source Oral, resp. rate 16, height 5\' 4"  (1.626 m), weight 103.4 kg, SpO2 100 %. Body mass index is 39.14 kg/m?. ? ?Mental Status Per Nursing Assessment::   ?On Admission:  Suicidal ideation indicated by patient, Self-harm thoughts, Self-harm behaviors ? ?Demographic Factors:  ?Adolescent or young adult and Caucasian ? ?Loss Factors: ?NA ? ?Historical Factors: ?NA ? ?Risk Reduction Factors:   ?Positive social support ? ?Continued Clinical Symptoms:  ?Patient reports that her depressive symptoms have improved and she currently denies SI/HI/AVH and is verbally contracting for safety outside of the hospital.  ? ?Cognitive Features That Contribute To Risk:  ?None   ? ?Suicide Risk:  ?Minimal: No identifiable suicidal ideation.  Patients presenting with no risk factors but with morbid ruminations; may be classified as minimal risk based on the severity of the depressive symptoms ? ? Follow-up Information   ? ? [facility]. Call.   ?Why: You may call to schedule an appointment with [phone] for therapy services if you wish to return to this provider. ?Contact information: ?[address] ?[place], [place] ? ?P:  [phone] ? ?  ?  ? ?  Dana AllanWalsh, Tanya, MD. Go on 03/04/2022.   ?Specialty: Family Medicine ?Why: You have a hospital follow up appointment for  primary care services on 03/04/22 at 2:30 pm.  This appointment will be held in person. ?Contact information: ?[address] ?[place] [address] ?[phone] ? ? ?  ?  ? ? [facility]

## 2022-02-28 NOTE — Discharge Summary (Addendum)
Physician Discharge Summary Note ? ?Patient:  Anita [name] is an 21 y.o., female ?MRN:  [id number] ?DOB:  2001/09/20 ?Patient phone:  [phone] (home)  ?Patient address:   ?[address] ?[place] [address],  ?Total Time spent with patient: 30 minutes ? ?Date of Admission:  02/23/2022 ?Date of Discharge: 02/28/2022 ? ?Reason For Admission:  Anita [name] is a 21 y.o Caucasian female who presented to the [facility] health urgent care [facility]) with worsening depressive symptoms, +suicidal thoughts & self injurious behaviors. She was deemed to be a danger to self and transferred  voluntarily to this [facility] for treatment and stabilization of her mood. ? ?                                        HOSPITAL COURSE: ?During the patient's hospitalization, patient had extensive initial psychiatric evaluation, and follow-up psychiatric evaluations every day.  Diagnoses provided & medications started upon initial assessment are as follows:  ?-Discontinued home Prozac d/t complaints of ineffectiveness ?-Started Abilify 5 mg nightly for mood stabilization & Psychosis ?-Changed Gabapentin to 100 mg in AM, 100 mg in the afternoon & 400 mg  nightly for chronic pain (complaints of sedation while on 400 mg TID) ?-Started Claritin daily for seasonal allergies ?-Started Flonase daily for Seasonal allergies ?-Started Colace 100 mg daily for constipation ?-Started Buspar 15 mg BID for anxiety ?-Started Hydroxyzine 25 mg every 6 hours PRN ?-Started Albuterol inhaler PRN for SOB/Wheezing ?-Started Drisdol 50,000 units weekly ?-Started Vitamin B-12 1,000 mcg daily ?  ?During the hospitalization, other adjustments were made to the patient's medication regimen, with medications at discharge being as follows: ?  ?-Titrated up to Abilify to 15 mg nightly for mood stabilization & Psychosis ?-Discontinued Gabapentin per pt's request ?-Continue Claritin daily for seasonal allergies ?-Continue Flonase daily for  Seasonal allergies ?-Continue Colace 100 mg daily for constipation ?-Titrated up to Buspar 20 mg BID for anxiety ?-Continue Hydroxyzine 25 mg every 6 hours PRN ?-Continue Albuterol inhaler PRN for SOB/Wheezing ?-Continue Drisdol 50,000 units weekly ?-Continue Vitamin B-12 1,000 mcg daily ? ?The patient denies having side effects to prescribed psychiatric medication. ?Patient's care was discussed during the interdisciplinary team meeting every day during the hospitalization. Gradually, patient started adjusting to milieu. The patient was evaluated each day by a clinical provider to ascertain response to treatment. Improvement was noted by the patient's report of decreasing symptoms, improved sleep and appetite, affect, medication tolerance, behavior, and participation in unit programming.  Patient was asked each day to complete a self inventory noting mood, mental status, pain, new symptoms, anxiety and concerns. Symptoms were reported as significantly decreased or resolved completely by discharge. She was counseled on the need to abstain from alcohol and illicit drug use after discharge. ? ?On day of discharge, the patient reports that their mood is stable. The patient denies having suicidal thoughts for more than 48 hours prior to discharge.  Patient denies having homicidal thoughts.  Patient denies having auditory hallucinations.  Patient denies any visual hallucinations or other symptoms of psychosis. The patient was motivated to continue taking medication with a goal of continued improvement in mental health.  ?  ?The patient reports that their target psychiatric symptoms of depression & psychosis responded well to the psychiatric medications, and the patient reports overall benefit from this psychiatric hospitalization. Supportive psychotherapy was provided to the  patient. The patient also participated in regular group therapy while hospitalized. Coping skills, problem solving as well as relaxation therapies  were also part of the unit programming. ?  ?Principal Problem: Schizoaffective disorder (HCC) ?Discharge Diagnoses: Principal Problem: ?  Schizoaffective disorder (HCC) ?Active Problems: ?  GAD (generalized anxiety disorder) ?  Chronic pain syndrome ?  Fibromyalgia ?  Seasonal allergies ?  PTSD (post-traumatic stress disorder) ?  Cannabis abuse ? ?Past Psychiatric History: as above ? ?Past Medical History:  ?Past Medical History:  ?Diagnosis Date  ? Anxiety   ? Asthma   ? age 21  ? Bipolar disorder (HCC)   ? Eczema   ? Migraines   ?  ?Past Surgical History:  ?Procedure Laterality Date  ? CHOLECYSTECTOMY N/A 01/03/2022  ? Procedure: LAPAROSCOPIC CHOLECYSTECTOMY WITH INTRAOPERATIVE CHOLANGIOGRAM;  Surgeon: Manus Ruddsuei, Matthew, MD;  Location: [facility] OR;  Service: General;  Laterality: N/A;  ? KNEE SURGERY  01/23/2015  ? L knee torn meniscus and ACL  ? TONSILLECTOMY AND ADENOIDECTOMY  2012  ? ?Family History:  ?Family History  ?Problem Relation Age of Onset  ? Alcohol abuse Mother   ? Depression Mother   ? Thyroid disease Mother   ? Heart attack Mother [id number]  ?     died of heart attack  ? Bipolar disorder Mother   ? Asthma Mother   ? Vision loss Sister   ? Asthma Father   ?     as a child  ? Hypertension Maternal Grandfather   ? ?Family Psychiatric  History: as above ?Social History:  ?Social History  ? ?Substance and Sexual Activity  ?Alcohol Use Not Currently  ?   ?Social History  ? ?Substance and Sexual Activity  ?Drug Use Yes  ? Frequency: 7.0 times per week  ? Types: Marijuana  ?  ?Social History  ? ?Socioeconomic History  ? Marital status: Significant Other  ?  Spouse name: Not on file  ? Number of children: Not on file  ? Years of education: Not on file  ? Highest education level: Not on file  ?Occupational History  ? Occupation: student  ?Tobacco Use  ? Smoking status: Light Smoker  ?  Types: Cigarettes  ? Smokeless tobacco: Never  ?Vaping Use  ? Vaping Use: Former  ?Substance and Sexual Activity  ? Alcohol use: Not  Currently  ? Drug use: Yes  ?  Frequency: 7.0 times per week  ?  Types: Marijuana  ? Sexual activity: Not Currently  ?  Comment: depo before  ?Other Topics Concern  ? Not on file  ?Social History Narrative  ? Lives with aunt (who has custody), cat and dog  ? ?Social Determinants of Health  ? ?Financial Resource Strain: Not on file  ?Food Insecurity: Not on file  ?Transportation Needs: Not on file  ?Physical Activity: Not on file  ?Stress: Not on file  ?Social Connections: Not on file  ? ?Physical Findings: ?AIMS: Facial and Oral Movements ?Muscles of Facial Expression: None, normal ?Lips and Perioral Area: None, normal ?Jaw: None, normal ?Tongue: None, normal,Extremity Movements ?Upper (arms, wrists, hands, fingers): None, normal ?Lower (legs, knees, ankles, toes): None, normal, Trunk Movements ?Neck, shoulders, hips: None, normal, Overall Severity ?Severity of abnormal movements (highest score from questions above): None, normal ?Incapacitation due to abnormal movements: None, normal ?Patient's awareness of abnormal movements (rate only patient's report): No Awareness, Dental Status ?Current problems with teeth and/or dentures?: No ?Does patient usually wear dentures?: No  ?CIWA:  n/a  ?  COWS: n/a   ? ?Musculoskeletal: ?Strength & Muscle Tone: within normal limits ?Gait & Station: normal ?Patient leans: N/A ? ? ?Psychiatric Specialty Exam: ? ?Presentation  ?General Appearance: Appropriate for Environment; Fairly Groomed ? ?Eye Contact:Good ? ?Speech:Clear and Coherent ? ?Speech Volume:Normal ? ?Handedness:Right ? ? ?Mood and Affect  ?Mood:Euthymic ? ?Affect:Appropriate; Congruent ? ? ?Thought Process  ?Thought Processes:Coherent ? ?Descriptions of Associations:Intact ? ?Orientation:Full (Time, Place and Person) ? ?Thought Content:Logical ? ?History of Schizophrenia/Schizoaffective disorder:No ? ?Duration of Psychotic Symptoms:No data recorded ?Hallucinations:Hallucinations: None ? ?Ideas of  Reference:None ? ?Suicidal Thoughts:Suicidal Thoughts: No ? ?Homicidal Thoughts:Homicidal Thoughts: No ? ? ?Sensorium  ?Memory:Immediate Good ? ?Judgment:Good ? ?Insight:Fair ? ? ?Executive Functions  ?Concentration:Good ? ?Attention [place]

## 2022-02-28 NOTE — Progress Notes (Signed)
?  [facility] Adult Case Management Discharge Plan : ? ?Will you be returning to the same living situation after discharge:  Yes,  Home  ?At discharge, do you have transportation home?: Yes,  Aunt  ?Do you have the ability to pay for your medications: Yes,  Medicaid  ? ?Release of information consent forms completed and in the chart;  Patient's signature needed at discharge. ? ?Patient to Follow up at: ? Follow-up Information   ? ? [facility]. Call.   ?Why: You may call to schedule an appointment with Marchelle Folks for therapy services if you wish to return to this provider. ?Contact information: ?[address] ?[place], [place] [address] ? ?P:  [phone] ? ?  ?  ? ? Dana Allan, MD. Go on 03/04/2022.   ?Specialty: Family Medicine ?Why: You have a hospital follow up appointment for primary care services on 03/04/22 at 2:30 pm.  This appointment will be held in person. ?Contact information: ?[address] ?[place] [address] ?[phone] ? ? ?  ?  ? ? [facility]. Go on 03/07/2022.   ?Why: You have an appointment for therapy services on 03/07/22 at 5:00 pm.     This appointment will be held in person. ?Contact information: ?[address], [place], [place] ?[place] [address] ?[phone] ? ? ?  ?  ? ?  ?  ? ?  ? ? ?Next level of care provider has access to [facility] Link:no ? ?Safety Planning and Suicide Prevention discussed: Yes,  with patient and aunt  ? ?  ? ?Has patient been referred to the Quitline?: Patient refused referral ? ?Patient has been referred for addiction treatment: Pt. refused referral ? ?Aram Beecham, LCSWA ?02/28/2022, 9:06 AM ?

## 2022-03-04 ENCOUNTER — Ambulatory Visit: Payer: Medicaid Other | Admitting: Family Medicine

## 2022-03-04 ENCOUNTER — Other Ambulatory Visit (HOSPITAL_BASED_OUTPATIENT_CLINIC_OR_DEPARTMENT_OTHER): Payer: Self-pay

## 2022-03-04 MED ORDER — TRAZODONE HCL 50 MG PO TABS
ORAL_TABLET | ORAL | 1 refills | Status: DC
  Filled 2022-03-04: qty 180, 90d supply, fill #0

## 2022-03-04 MED ORDER — NORTRIPTYLINE HCL 10 MG PO CAPS
ORAL_CAPSULE | ORAL | 2 refills | Status: AC
  Filled 2022-03-04: qty 30, 30d supply, fill #0

## 2022-03-04 MED ORDER — MONTELUKAST SODIUM 10 MG PO TABS
ORAL_TABLET | ORAL | 2 refills | Status: AC
  Filled 2022-03-04: qty 30, 30d supply, fill #0

## 2022-03-18 ENCOUNTER — Emergency Department (HOSPITAL_BASED_OUTPATIENT_CLINIC_OR_DEPARTMENT_OTHER)
Admission: EM | Admit: 2022-03-18 | Discharge: 2022-03-19 | Disposition: A | Discharge status: Other Acute Inpatient Hospital | Payer: Medicaid Other | Attending: Emergency Medicine | Admitting: Emergency Medicine

## 2022-03-18 ENCOUNTER — Encounter (HOSPITAL_BASED_OUTPATIENT_CLINIC_OR_DEPARTMENT_OTHER): Payer: Self-pay | Admitting: Emergency Medicine

## 2022-03-18 ENCOUNTER — Other Ambulatory Visit: Payer: Self-pay

## 2022-03-18 DIAGNOSIS — F32A Depression, unspecified: Secondary | ICD-10-CM

## 2022-03-18 DIAGNOSIS — F1090 Alcohol use, unspecified, uncomplicated: Secondary | ICD-10-CM | POA: Insufficient documentation

## 2022-03-18 DIAGNOSIS — F909 Attention-deficit hyperactivity disorder, unspecified type: Secondary | ICD-10-CM | POA: Diagnosis not present

## 2022-03-18 DIAGNOSIS — Z20822 Contact with and (suspected) exposure to covid-19: Secondary | ICD-10-CM | POA: Insufficient documentation

## 2022-03-18 DIAGNOSIS — Z79899 Other long term (current) drug therapy: Secondary | ICD-10-CM | POA: Diagnosis not present

## 2022-03-18 DIAGNOSIS — F419 Anxiety disorder, unspecified: Secondary | ICD-10-CM | POA: Diagnosis not present

## 2022-03-18 DIAGNOSIS — Z818 Family history of other mental and behavioral disorders: Secondary | ICD-10-CM | POA: Insufficient documentation

## 2022-03-18 DIAGNOSIS — F259 Schizoaffective disorder, unspecified: Secondary | ICD-10-CM | POA: Insufficient documentation

## 2022-03-18 DIAGNOSIS — R45851 Suicidal ideations: Secondary | ICD-10-CM | POA: Insufficient documentation

## 2022-03-18 DIAGNOSIS — F319 Bipolar disorder, unspecified: Secondary | ICD-10-CM | POA: Diagnosis not present

## 2022-03-18 DIAGNOSIS — Z560 Unemployment, unspecified: Secondary | ICD-10-CM | POA: Diagnosis not present

## 2022-03-18 DIAGNOSIS — Z9151 Personal history of suicidal behavior: Secondary | ICD-10-CM | POA: Diagnosis not present

## 2022-03-18 DIAGNOSIS — F1290 Cannabis use, unspecified, uncomplicated: Secondary | ICD-10-CM | POA: Diagnosis not present

## 2022-03-18 DIAGNOSIS — R450 Nervousness: Secondary | ICD-10-CM | POA: Insufficient documentation

## 2022-03-18 DIAGNOSIS — F329 Major depressive disorder, single episode, unspecified: Secondary | ICD-10-CM | POA: Insufficient documentation

## 2022-03-18 LAB — COMPREHENSIVE METABOLIC PANEL
ALT: <5 U/L (ref 0–44)
AST: 11 U/L — ABNORMAL LOW (ref 15–41)
Albumin: 4.1 g/dL (ref 3.5–5.0)
Alkaline Phosphatase: 51 U/L (ref 38–126)
Anion gap: 7 (ref 5–15)
BUN: 11 mg/dL (ref 6–20)
CO2: 26 mmol/L (ref 22–32)
Calcium: 8.8 mg/dL — ABNORMAL LOW (ref 8.9–10.3)
Chloride: 107 mmol/L (ref 98–111)
Creatinine, Ser: 0.62 mg/dL (ref 0.44–1.00)
GFR, Estimated: >60 mL/min (ref >60)
Glucose, Bld: 90 mg/dL (ref 70–99)
Potassium: 3.6 mmol/L (ref 3.5–5.1)
Sodium: 140 mmol/L (ref 135–145)
Total Bilirubin: 0.2 mg/dL — ABNORMAL LOW (ref 0.3–1.2)
Total Protein: 6.6 g/dL (ref 6.5–8.1)

## 2022-03-18 LAB — CBC
HCT: 36.5 % (ref 36.0–46.0)
Hemoglobin: 12.4 g/dL (ref 12.0–15.0)
MCH: 31.1 pg (ref 26.0–34.0)
MCHC: 34 g/dL (ref 30.0–36.0)
MCV: 91.5 fL (ref 80.0–100.0)
Platelets: 286 10*3/uL (ref 150–400)
RBC: 3.99 MIL/uL (ref 3.87–5.11)
RDW: 12.5 % (ref 11.5–15.5)
WBC: 7.5 10*3/uL (ref 4.0–10.5)
nRBC: 0 % (ref 0.0–0.2)

## 2022-03-18 LAB — RESP PANEL BY RT-PCR (FLU A&B, COVID) ARPGX2
Influenza A by PCR: NEGATIVE
Influenza B by PCR: NEGATIVE
SARS Coronavirus 2 by RT PCR: NEGATIVE

## 2022-03-18 LAB — RAPID URINE DRUG SCREEN, HOSP PERFORMED
Amphetamines: NOT DETECTED
Barbiturates: NOT DETECTED
Benzodiazepines: NOT DETECTED
Cocaine: NOT DETECTED
Opiates: POSITIVE — AB
Tetrahydrocannabinol: POSITIVE — AB

## 2022-03-18 LAB — PREGNANCY, URINE: Preg Test, Ur: NEGATIVE

## 2022-03-18 LAB — SALICYLATE LEVEL: Salicylate Lvl: <7 mg/dL — ABNORMAL LOW (ref 7.0–30.0)

## 2022-03-18 LAB — ETHANOL: Alcohol, Ethyl (B): 10 mg/dL — ABNORMAL HIGH (ref <10)

## 2022-03-18 LAB — ACETAMINOPHEN LEVEL: Acetaminophen (Tylenol), Serum: <10 ug/mL — ABNORMAL LOW (ref 10–30)

## 2022-03-18 NOTE — ED Notes (Signed)
Tts at bedside. Awaiting call

## 2022-03-18 NOTE — ED Triage Notes (Signed)
Pt states she has been cutting on her wrist, ankles and upper thighs. She states that she knows this is not to kill herself and that it is stupid but she does it so that she can breath

## 2022-03-18 NOTE — ED Provider Notes (Signed)
[facility] EMERGENCY DEPT Provider Note   CSN: [id number] Arrival date & time: 03/18/22  2026     History  Chief Complaint  Patient presents with   Medical Clearance    Mattalyn Irizarry is a 21 y.o. female.  Pt is a 21 yo female presenting from home with pmh of anxiety and depression presenting for complaints of depression. Pt states she has had increased depression, increaed sleep, self mutilation on right arm this week, and feelings of worthlessness. Pt denies suicidal ideation or threats. Admits to marijuana use daily, 1.5 g daily. Denies alcohol use or any other substance use. Denies increased energy or decreased need for sleep. Denies visual or auditory hallucinations. States she is compliant on Prozac, Buspar, Abilify, and Trazodone. Recently discharged from inpatient stay on 5/11. New meds started at that time was the Abilify and Trazodone.   The history is provided by the patient. No language interpreter was used.      Home Medications Prior to Admission medications   Medication Sig Start Date End Date Taking? Authorizing Provider  albuterol (VENTOLIN HFA) 108 (90 Base) MCG/ACT inhaler Inhale two puffs into the lungs every 4 (four) hours as needed for Wheezing or Shortness of Breath. Patient taking differently: Inhale 2 puffs into the lungs every 4 (four) hours as needed for wheezing or shortness of breath. 09/24/21     ARIPiprazole (ABILIFY) 15 MG tablet Take 1 tablet (15 mg total) by mouth daily. 03/01/22   Nicholes Rough, NP  busPIRone (BUSPAR) 10 MG tablet Take 2 tablets (20 mg total) by mouth 2 (two) times daily. 02/28/22   Nicholes Rough, NP  docusate sodium (COLACE) 100 MG capsule Take 1 capsule (100 mg total) by mouth 2 (two) times daily. 02/28/22   Nicholes Rough, NP  fluticasone (FLONASE) 50 MCG/ACT nasal spray Place 2 sprays into both nostrils daily. 01/27/22   [provider]  hydrOXYzine (ATARAX) 25 MG tablet Take 1 tablet (25 mg total) by  mouth every 6 (six) hours as needed for anxiety. 02/28/22   Nicholes Rough, NP  loratadine (CLARITIN) 10 MG tablet Take 1 tablet (10 mg total) by mouth daily. 02/28/22   Nicholes Rough, NP  montelukast (SINGULAIR) 10 MG tablet Take 1 tablet (10 mg dose) by mouth daily. 03/04/22     nicotine (NICODERM CQ - DOSED IN MG/24 HOURS) 21 mg/24hr patch Place 1 patch (21 mg total) onto the skin daily. 03/01/22   Nicholes Rough, NP  nortriptyline (PAMELOR) 10 MG capsule Take 1 capsule (10 mg dose) by mouth at bedtime. 03/04/22     traZODone (DESYREL) 100 MG tablet Take 1 tablet (100 mg total) by mouth at bedtime. 02/28/22   Nicholes Rough, NP  traZODone (DESYREL) 50 MG tablet Take 2 tablets (100 mg dose) by mouth at bedtime. 03/04/22     vitamin B-12 1000 MCG tablet Take 1 tablet (1,000 mcg total) by mouth daily. 03/01/22   Nicholes Rough, NP  Vitamin D, Ergocalciferol, (DRISDOL) 1.25 MG (50000 UNIT) CAPS capsule Take 1 capsule (50,000 Units total) by mouth once a week. 03/02/22   Nicholes Rough, NP      Allergies    Kiwi extract; Mobic [meloxicam]; and Antihistamines, diphenhydramine-type    Review of Systems   Review of Systems  Constitutional:  Negative for chills and fever.  HENT:  Negative for ear pain and sore throat.   Eyes:  Negative for pain and visual disturbance.  Respiratory:  Negative for cough and shortness of breath.  Cardiovascular:  Negative for chest pain and palpitations.  Gastrointestinal:  Negative for abdominal pain and vomiting.  Genitourinary:  Negative for dysuria and hematuria.  Musculoskeletal:  Negative for arthralgias and back pain.  Skin:  Negative for color change and rash.  Neurological:  Negative for seizures and syncope.  Psychiatric/Behavioral:  Positive for self-injury and sleep disturbance. Negative for suicidal ideas. The patient is not nervous/anxious.   All other systems reviewed and are negative.  Physical Exam Updated Vital Signs BP (!) 100/50 (BP Location: Right  Arm)   Pulse 81   Temp 97.9 F (36.6 C) (Oral)   Resp 18   Ht 5\' 4"  (1.626 m)   Wt 104.3 kg   LMP 03/18/2022 (Exact Date)   SpO2 98%   BMI 39.48 kg/m  Physical Exam Vitals and nursing note reviewed.  Constitutional:      General: She is not in acute distress.    Appearance: She is well-developed.  HENT:     Head: Normocephalic and atraumatic.  Eyes:     Conjunctiva/sclera: Conjunctivae normal.  Cardiovascular:     Rate and Rhythm: Normal rate and regular rhythm.     Heart sounds: No murmur heard. Pulmonary:     Effort: Pulmonary effort is normal. No respiratory distress.     Breath sounds: Normal breath sounds.  Abdominal:     Palpations: Abdomen is soft.     Tenderness: There is no abdominal tenderness.  Musculoskeletal:        General: No swelling.     Cervical back: Neck supple.  Skin:    General: Skin is warm and dry.     Capillary Refill: Capillary refill takes less than 2 seconds.  Neurological:     Mental Status: She is alert.  Psychiatric:        Attention and Perception: She does not perceive auditory or visual hallucinations.        Mood and Affect: Mood normal. Affect is flat.        Speech: Speech is delayed.        Behavior: Behavior is slowed. Behavior is cooperative.        Thought Content: Thought content is not delusional. Thought content does not include homicidal or suicidal ideation. Thought content does not include homicidal or suicidal plan.    ED Results / Procedures / Treatments   Labs (all labs ordered are listed, but only abnormal results are displayed) Labs Reviewed  COMPREHENSIVE METABOLIC PANEL - Abnormal; Notable for the following components:      Result Value   Calcium 8.8 (*)    AST 11 (*)    Total Bilirubin 0.2 (*)    All other components within normal limits  ETHANOL - Abnormal; Notable for the following components:   Alcohol, Ethyl (B) 10 (*)    All other components within normal limits  SALICYLATE LEVEL - Abnormal; Notable  for the following components:   Salicylate Lvl [id number] (*)    All other components within normal limits  ACETAMINOPHEN LEVEL - Abnormal; Notable for the following components:   Acetaminophen (Tylenol), Serum <10 (*)    All other components within normal limits  RAPID URINE DRUG SCREEN, HOSP PERFORMED - Abnormal; Notable for the following components:   Opiates POSITIVE (*)    Tetrahydrocannabinol POSITIVE (*)    All other components within normal limits  RESP PANEL BY RT-PCR (FLU A&B, COVID) ARPGX2  CBC  PREGNANCY, URINE    EKG None  Radiology No results found.  Procedures Procedures    Medications Ordered in ED Medications - No data to display  ED Course/ Medical Decision Making/ A&P                           Medical Decision Making Amount and/or Complexity of Data Reviewed Labs: ordered.   21 yo female presenting from home with pmh of anxiety and depression presenting for complaints of depression.  Patient is alert and oriented x3, no acute distress, afebrile, stable vital signs.  Physical exam demonstrates superficial small lacerations to distal left wrist.  No need for wound care or Tdap at this time.  Patient on several sedating medication and is blunted and slow on exam.  Recently started on BuSpar and trazodone.  Recommend decreasing or stopping 1 of these medications.  Will defer to behavioral health specialist.  Patient medically cleared and safe for evaluation by behavioral health team.        Final Clinical Impression(s) / ED Diagnoses Final diagnoses:  Depression, unspecified depression type    Rx / DC Orders ED Discharge Orders     None         Lianne Cure, DO [id number]
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
## 2022-03-18 NOTE — ED Triage Notes (Signed)
Pt states that she needs to go to the behavioral hospital and this is her way of getting there. She states she has been sleeping a lot and having really bad dreams that seems real and she has to make sure in the morning that they are not real. She states that nothing feels right and the thought of not existing sounds good but she does not want to kill herself and does not have a plan. She states that everyday feels like she is going through the motion just getting to point a and point b. Pt states she lives with her aunt who is the only supportive family member. Pt was at [facility] from May 6th-may 12 and felt better while there and ever since being home she has not felt right and that her aunt and friend says she needs to be readmitted. States its not like I want to hurt myself but I am numb to the idea and it runs through my mind.

## 2022-03-19 ENCOUNTER — Ambulatory Visit (HOSPITAL_COMMUNITY)
Admission: EM | Admit: 2022-03-19 | Discharge: 2022-03-19 | Disposition: A | Discharge status: Home / Self Care | Payer: Medicaid Other | Source: Home / Self Care

## 2022-03-19 ENCOUNTER — Other Ambulatory Visit: Payer: Self-pay

## 2022-03-19 DIAGNOSIS — F259 Schizoaffective disorder, unspecified: Secondary | ICD-10-CM | POA: Insufficient documentation

## 2022-03-19 DIAGNOSIS — Z818 Family history of other mental and behavioral disorders: Secondary | ICD-10-CM | POA: Insufficient documentation

## 2022-03-19 DIAGNOSIS — R450 Nervousness: Secondary | ICD-10-CM | POA: Insufficient documentation

## 2022-03-19 DIAGNOSIS — R45851 Suicidal ideations: Secondary | ICD-10-CM | POA: Insufficient documentation

## 2022-03-19 DIAGNOSIS — Z560 Unemployment, unspecified: Secondary | ICD-10-CM | POA: Insufficient documentation

## 2022-03-19 DIAGNOSIS — Z9151 Personal history of suicidal behavior: Secondary | ICD-10-CM | POA: Insufficient documentation

## 2022-03-19 DIAGNOSIS — F419 Anxiety disorder, unspecified: Secondary | ICD-10-CM | POA: Insufficient documentation

## 2022-03-19 DIAGNOSIS — F319 Bipolar disorder, unspecified: Secondary | ICD-10-CM | POA: Insufficient documentation

## 2022-03-19 DIAGNOSIS — F1290 Cannabis use, unspecified, uncomplicated: Secondary | ICD-10-CM | POA: Insufficient documentation

## 2022-03-19 DIAGNOSIS — Z79899 Other long term (current) drug therapy: Secondary | ICD-10-CM | POA: Insufficient documentation

## 2022-03-19 DIAGNOSIS — F909 Attention-deficit hyperactivity disorder, unspecified type: Secondary | ICD-10-CM | POA: Insufficient documentation

## 2022-03-19 DIAGNOSIS — F1090 Alcohol use, unspecified, uncomplicated: Secondary | ICD-10-CM | POA: Insufficient documentation

## 2022-03-19 DIAGNOSIS — F339 Major depressive disorder, recurrent, unspecified: Secondary | ICD-10-CM

## 2022-03-19 MED ORDER — MAGNESIUM HYDROXIDE 400 MG/5ML PO SUSP: 30.0000 mL | Freq: Every day | ORAL | Status: DC | PRN

## 2022-03-19 MED ORDER — ALUM & MAG HYDROXIDE-SIMETH 200-200-20 MG/5ML PO SUSP: 30.0000 mL | ORAL | Status: DC | PRN

## 2022-03-19 MED ORDER — ACETAMINOPHEN 325 MG PO TABS: 650.0000 mg | ORAL_TABLET | Freq: Four times a day (QID) | ORAL | Status: DC | PRN

## 2022-03-19 NOTE — ED Notes (Signed)
Pt resting quietly.  Breathing even and unlabored. Pt is in view of nursing station and staff will cont to monitor for safety.  

## 2022-03-19 NOTE — ED Notes (Signed)
Patient discharged.

## 2022-03-19 NOTE — ED Provider Notes (Signed)
FBC/OBS ASAP Discharge Summary  Date and Time: 03/19/2022 11:18 AM  Name: Anita [name]  MRN:  [id number]   Discharge Diagnoses:  Final diagnoses:  Suicidal ideation  Recurrent major depressive disorder, remission status unspecified ([place])    Subjective: Anita [name] 21 y.o., female patient presented to [facility] as a transfer from [place]. She initially presented with worsening SI with no plan. She was admitted to the continuous assessment unit.     Anita [name], 21 y.o., female patient seen face to face by this provider, consulted with Dr. Serafina Mitchell; and chart reviewed on 03/19/22.  Per chart review patient was admitted to [facility] 02/23/2022-02/28/2022. She was discharged on Ability 15mg  QD, Buspar 10 mg BID, Vitamin B12 1000 mcg daily, Hydroxyzine 25 mg Q6H,Trazodone 100 mg QHS PRN,and Vitamin D 50000 QW. She has services in place with [facility] for medication management. She has therapy in place with [facility].   On evaluation Anita [name] reports no recent stressors/triggers. States since she was released from the hospital she has followed up with her provider and therapist. She lives with her aunt and states her aunt is concerned because her depression does not seem to be improving. Her aunt and ex boyfriend encouraged her to present to the emergency department.   On todays assessment Anita [name] is observed laying in the bed asleep. She is easily awakened. She is alert, oriented x 4, and cooperative. She  has normal speech and makes good eye contact. She endorses depression and anxiety with feelings of helplessness, decreased motivation, decreased energy, over sleeping and decreased appetite. Objectively there is no evidence of psychosis/mania or delusional thinking.  Patient is able to converse coherently, goal directed thoughts, no distractibility, or pre-occupation.  She denies suicidal/homicidal ideation/AVH,  psychosis, and paranoia. She contracts for safety. States, "I never told anyone I had a plan to hurt myself, I just don't feel like my depression is getting any better". She denies any intent, plan or access to means. She denies access to firearms/weapons. She endorsed self harm/cutting before presenting to [facility] ED, however there are no visible cuts on patients arms. There are a few old scars but no appearance of any new cuts.   Patient answered question appropriately.     Patient states she does not want to stay in facility any longer and  is requesting to be discharged. Discussed PHP program and patient is in agreement. She has a screening appointment on 03/21/2022 at 1pm with [place] outpatient services.   Collateral: Anita [name] (patients aunt) [phone] contacted with patients permission. Discussed plan of care with patients permission. Aunt has no immediate concerns with patient being discharged and returning home.   Stay Summary:   Anita [name] was admitted to [facility] Continuous Assessment unit for  SI and increased depression.  She was monitored by continuous assessment/observation and her emotional and mental status was also monitored by staff. She exhibited no unsafe behaviors while on the unit. She adamantly denies SI, and is requesting to be discharged. She does not meet criteria for IVC. She was referred to [facility] PHP program.            Anita [name] will follow up with the services as listed below under Follow up Information.    Upon completion of this admission the Anita [name] was both mentally and medically stable for discharge denying suicidal/homicidal ideation, auditory/visual/tactile hallucinations, delusional thoughts and paranoia.  Total Time spent with patient: 30 minutes  Past Psychiatric History: see h&p Past Medical History:  Past Medical History:  Diagnosis Date   Anxiety    Asthma    age 58   Bipolar disorder ([place])    Eczema    Migraines      Past Surgical History:  Procedure Laterality Date   CHOLECYSTECTOMY N/A 01/03/2022   Procedure: LAPAROSCOPIC CHOLECYSTECTOMY WITH INTRAOPERATIVE CHOLANGIOGRAM;  Surgeon: Donnie Mesa, MD;  Location: [facility];  Service: General;  Laterality: N/A;   KNEE SURGERY  01/23/2015   L knee torn meniscus and ACL   TONSILLECTOMY AND ADENOIDECTOMY  2012   Family History:  Family History  Problem Relation Age of Onset   Alcohol abuse Mother    Depression Mother    Thyroid disease Mother    Heart attack Mother 45       died of heart attack   Bipolar disorder Mother    Asthma Mother    Vision loss Sister    Asthma Father        as a child   Hypertension Maternal Grandfather    Family Psychiatric History: see H&P Social History:  Social History   Substance and Sexual Activity  Alcohol Use Not Currently     Social History   Substance and Sexual Activity  Drug Use Yes   Frequency: 7.0 times per week   Types: Marijuana   Comment: .5 gm per day    Social History   Socioeconomic History   Marital status: Significant Other    Spouse name: Not on file   Number of children: Not on file   Years of education: Not on file   Highest education level: Not on file  Occupational History   Occupation: student  Tobacco Use   Smoking status: Former    Types: Cigarettes    Quit date: 03/11/2022    Years since quitting: 0.0   Smokeless tobacco: Never  Vaping Use   Vaping Use: Former   Quit date: 03/11/2022  Substance and Sexual Activity   Alcohol use: Not Currently   Drug use: Yes    Frequency: 7.0 times per week    Types: Marijuana    Comment: .5 gm per day   Sexual activity: Not Currently    Comment: depo before  Other Topics Concern   Not on file  Social History Narrative   Lives with aunt (who has custody), cat and dog   Social Determinants of Health   Financial Resource Strain: Not on file  Food Insecurity: Not on file  Transportation Needs: Not on file  Physical Activity:  Not on file  Stress: Not on file  Social Connections: Not on file   SDOH:  SDOH Screenings   Alcohol Screen: Low Risk    Last Alcohol Screening Score (AUDIT): 4  Depression (PHQ2-9): Not on file  Financial Resource Strain: Not on file  Food Insecurity: Not on file  Housing: Not on file  Physical Activity: Not on file  Social Connections: Not on file  Stress: Not on file  Tobacco Use: Medium Risk   Smoking Tobacco Use: Former   Smokeless Tobacco Use: Never   Passive Exposure: Not on file  Transportation Needs: Not on file    Tobacco Cessation:  N/A, patient does not currently use tobacco products  Current Medications:  Current Facility-Administered Medications  Medication Dose Route Frequency Provider Last Rate Last Admin   acetaminophen (TYLENOL) tablet 650 mg  650 mg Oral Q6H PRN  Evette Georges, NP       alum & mag hydroxide-simeth (MAALOX/MYLANTA) 200-200-20 MG/5ML suspension 30 mL  30 mL Oral Q4H PRN Evette Georges, NP       magnesium hydroxide (MILK OF MAGNESIA) suspension 30 mL  30 mL Oral Daily PRN Evette Georges, NP       Current Outpatient Medications  Medication Sig Dispense Refill   amphetamine-dextroamphetamine (ADDERALL) 20 MG tablet Take 20 mg by mouth 2 (two) times daily.     FLUoxetine (PROZAC) 20 MG capsule Take 40 mg by mouth daily.     albuterol (VENTOLIN HFA) 108 (90 Base) MCG/ACT inhaler Inhale two puffs into the lungs every 4 (four) hours as needed for Wheezing or Shortness of Breath. (Patient taking differently: Inhale 2 puffs into the lungs every 4 (four) hours as needed for wheezing or shortness of breath.) 18 g 2   ARIPiprazole (ABILIFY) 15 MG tablet Take 1 tablet (15 mg total) by mouth daily. 30 tablet 0   busPIRone (BUSPAR) 10 MG tablet Take 2 tablets (20 mg total) by mouth 2 (two) times daily. 120 tablet 0   fluticasone (FLONASE) 50 MCG/ACT nasal spray Place 2 sprays into both nostrils daily.     hydrOXYzine (ATARAX) 25 MG tablet Take 1 tablet (25 mg  total) by mouth every 6 (six) hours as needed for anxiety. 30 tablet 0   loratadine (CLARITIN) 10 MG tablet Take 1 tablet (10 mg total) by mouth daily.     montelukast (SINGULAIR) 10 MG tablet Take 1 tablet (10 mg dose) by mouth daily. 30 tablet 2   nortriptyline (PAMELOR) 10 MG capsule Take 1 capsule (10 mg dose) by mouth at bedtime. 30 capsule 2   traZODone (DESYREL) 100 MG tablet Take 1 tablet (100 mg total) by mouth at bedtime. 30 tablet 0   vitamin B-12 1000 MCG tablet Take 1 tablet (1,000 mcg total) by mouth daily. 30 tablet 0   Vitamin D, Ergocalciferol, (DRISDOL) 1.25 MG (50000 UNIT) CAPS capsule Take 1 capsule (50,000 Units total) by mouth once a week. 5 capsule 0    PTA Medications: (Not in a hospital admission)   Musculoskeletal  Strength & Muscle Tone: within normal limits Gait & Station: normal Patient leans: N/A  Psychiatric Specialty Exam  Presentation  General Appearance: Appropriate for Environment; Casual  Eye Contact:Fair  Speech:Clear and Coherent; Normal Rate  Speech Volume:Normal  Handedness:Right   Mood and Affect  Mood:Anxious; Depressed  Affect:Congruent   Thought Process  Thought Processes:Coherent  Descriptions of Associations:Circumstantial  Orientation:Full (Time, Place and Person)  Thought Content:Logical  Diagnosis of Schizophrenia or Schizoaffective disorder in past: Yes    Hallucinations:Hallucinations: None  Ideas of Reference:None  Suicidal Thoughts:Suicidal Thoughts: No SI Active Intent and/or Plan: Without Plan  Homicidal Thoughts:Homicidal Thoughts: No   Sensorium  Memory:Immediate Good; Remote Good; Recent Good  Judgment:Good  Insight:Good   Executive Functions  Concentration:Good  Attention Span:Good  [place] of Knowledge:Good  Language:Good   Psychomotor Activity  Psychomotor Activity:Psychomotor Activity: Normal   Assets  Assets:Communication Skills; Desire for Improvement; Financial  Resources/Insurance; Physical Health; Resilience; Social Support   Sleep  Sleep:Sleep: Fair   Nutritional Assessment (For OBS and FBC admissions only) Has the patient had a weight loss or gain of 10 pounds or more in the last 3 months?: No Has the patient had a decrease in food intake/or appetite?: No Does the patient have dental problems?: No Does the patient have eating habits or behaviors that may be  indicators of an eating disorder including binging or inducing vomiting?: No Has the patient recently lost weight without trying?: 0 Has the patient been eating poorly because of a decreased appetite?: 0 Malnutrition Screening Tool Score: 0    Physical Exam  Physical Exam Vitals and nursing note reviewed.  Constitutional:      General: She is not in acute distress.    Appearance: Normal appearance. She is not ill-appearing.  HENT:     Head: Normocephalic.  Eyes:     General:        Right eye: No discharge.        Left eye: No discharge.     Conjunctiva/sclera: Conjunctivae normal.  Cardiovascular:     Rate and Rhythm: Normal rate.  Pulmonary:     Effort: Pulmonary effort is normal. No respiratory distress.  Musculoskeletal:        General: Normal range of motion.     Cervical back: Normal range of motion.  Skin:    Coloration: Skin is not jaundiced or pale.  Neurological:     Mental Status: She is alert and oriented to person, place, and time.  Psychiatric:        Attention and Perception: Attention and perception normal.        Mood and Affect: Mood is anxious and depressed.        Speech: Speech normal.        Behavior: Behavior normal. Behavior is cooperative.        Thought Content: Thought content normal.        Cognition and Memory: Cognition normal.        Judgment: Judgment normal.   Review of Systems  Constitutional: Negative.   HENT: Negative.    Eyes: Negative.   Respiratory: Negative.    Cardiovascular: Negative.   Musculoskeletal: Negative.    Skin: Negative.   Neurological: Negative.   Psychiatric/Behavioral:  Positive for depression. The patient is nervous/anxious.   Blood pressure 111/72, pulse 66, temperature 98 F (36.7 C), temperature source Oral, resp. rate 18, last menstrual period 03/18/2022, SpO2 100 %. There is no height or weight on file to calculate BMI.  Demographic Factors:  Adolescent or young adult, Caucasian, and Unemployed  Loss Factors: Financial problems/change in socioeconomic status  Historical Factors: Prior suicide attempts  Risk Reduction Factors:   Sense of responsibility to family, Living with another person, especially a relative, Positive social support, Positive therapeutic relationship, and Positive coping skills or problem solving skills  Continued Clinical Symptoms:  Severe Anxiety and/or Agitation Depression:   Hopelessness  Cognitive Features That Contribute To Risk:  None    Suicide Risk:  Minimal: No identifiable suicidal ideation.  Patients presenting with no risk factors but with morbid ruminations; may be classified as minimal risk based on the severity of the depressive symptoms  Plan Of Care/Follow-up recommendations:  Activity:  as tolerated  Diet:  regular   Disposition:   Discharge patient   Discussed PHP program and patient is in agreement. She has a screening appointment on 03/21/2022 at 1pm with [place] outpatient services.   Patient will follow up with her provider at [facility].   No evidence of imminent risk to self or others at present.    Patient does not meet criteria for psychiatric inpatient admission. Discussed crisis plan, support from social network, calling 911, coming to the Emergency Department, and calling Suicide Hotline.   Revonda Humphrey, NP 03/19/2022,  11:18 AM

## 2022-03-19 NOTE — ED Notes (Signed)
TTS consult started. This tech exited the room for pt privacy. Will return when consult ended.

## 2022-03-19 NOTE — ED Notes (Signed)
Report given to [facility] 

## 2022-03-19 NOTE — ED Notes (Signed)
Provider in to evaluate pt at this time

## 2022-03-19 NOTE — ED Notes (Signed)
Pt A&O x 4, transfer from [facility] ED, presents with suicidal ideations and self harming behaviors of cutting herself.  Pt sated I don't want to kill myself, but I'm hypersensitive to ways that I could.  Pt calm & cooperative, no distress noted.  Monitoring for safety.

## 2022-03-19 NOTE — ED Provider Notes (Signed)
[facility] Continuous Assessment Admission H&P  Date: 03/19/22 Patient Name: Anita [name] MRN: [id number] Chief Complaint:  Chief Complaint  Patient presents with   Suicidal      Diagnoses:  Final diagnoses:  Suicidal ideation  Recurrent major depressive disorder, remission status unspecified (HCC)    HPI: Anita [name],  21 y.o female,  with a history of Schizoaffective dx,  and ADHD, presented to [facility] as a transfer from [facility].  For suicidal ideation, per the patient I do not want to exist.  Per the patient she has no psychiatrist, however she sees her PCP who prescribed her her medications,  she takes Abilify 15 mg daily, Atarax 25 mg q6h,  BuSpar  (2 tablet) BID, see pt MAR.  [facility].  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  Patient lives with her aunt.    Observation of patient, patient is alert and oriented x4 speech is clear mood depressed affect flat congruent with mood.  Maintained good eye contact.  Patient report suicidal ideation.  Patient stated I do not want to exist, but patient has no plans to commit suicide.   Patient denies HI, AVH, paranoia.  Per the patient she smoked marijuana daily last use was yesterday where she had half a gram.  Patient report drinking only 1 shot of alcohol yesterday and reports she does not drink on a regular basis.  According to patient she has been using marijuana since age 44,  pt denies any other illicit drug use.  Recommend inpatient observation   PHQ 2-9:  Flowsheet Row Office Visit from 08/16/2020 in [facility]  Thoughts that you would be better off dead, or of hurting yourself in some way Several days  PHQ-9 Total Score 14       Flowsheet Row ED from 03/19/2022 in [facility] ED from 03/18/2022 in [facility]  [facility] (Dis[facility]) [facility]  C-SSRS RISK CATEGORY Error: Question 6 not populated High Risk High Risk        Total Time spent with patient: 20 minutes  Musculoskeletal  Strength & Muscle Tone: within normal limits Gait & Station: normal Patient leans: N/A  Psychiatric Specialty Exam  Presentation General Appearance: Casual  Eye Contact:Fair  Speech:Clear and Coherent  Speech Volume:Normal  Handedness:Ambidextrous   Mood and Affect  Mood:Anxious; Depressed; Hopeless  Affect:Congruent; Flat   Thought Process  Thought Processes:Coherent  Descriptions of Associations:Circumstantial  Orientation:Full (Time, Place and Person)  Thought Content:WDL  Diagnosis of Schizophrenia or Schizoaffective disorder in past: Yes   Hallucinations:Hallucinations: None  Ideas of Reference:None  Suicidal Thoughts:Suicidal Thoughts: Yes, Active SI Active Intent and/or Plan: Without Plan  Homicidal Thoughts:Homicidal Thoughts: No   Sensorium  Memory:Immediate Fair  Judgment:Fair  Insight:Fair   Executive Functions  Concentration:Fair  Attention Span:Fair  Recall:Fair  Fund of Knowledge:Fair  Language:Fair   Psychomotor Activity  Psychomotor Activity:Psychomotor Activity: Normal   Assets  Assets:Desire for Improvement   Sleep  Sleep:Sleep: Fair   Nutritional Assessment (For OBS and FBC admissions only) Has the patient had a weight loss or gain of 10 pounds or more in the last 3 months?: No Has the patient had a decrease in food intake/or appetite?: No Does the patient have dental problems?: No Does the patient have eating habits or behaviors that  may be indicators of an eating disorder including binging or inducing vomiting?: No Has the patient recently lost weight without trying?: 0 Has the patient been eating poorly because of a decreased appetite?: 0 Malnutrition Screening Tool Score:  0    Physical Exam HENT:     Head: Normocephalic.     Nose: Nose normal.  Pulmonary:     Effort: Pulmonary effort is normal.  Musculoskeletal:        General: Normal range of motion.     Cervical back: Normal range of motion.  Skin:    General: Skin is warm.  Neurological:     General: No focal deficit present.     Mental Status: She is alert.  Psychiatric:        Mood and Affect: Mood normal.        Behavior: Behavior normal.        Thought Content: Thought content normal.        Judgment: Judgment normal.   Review of Systems  Constitutional: Negative.   HENT: Negative.    Eyes: Negative.   Respiratory: Negative.    Cardiovascular: Negative.   Gastrointestinal: Negative.   Genitourinary: Negative.   Musculoskeletal: Negative.   Skin: Negative.   Neurological: Negative.   Endo/Heme/Allergies: Negative.   Psychiatric/Behavioral:  Positive for depression and suicidal ideas. The patient is nervous/anxious.    Blood pressure 111/72, pulse 67, temperature 98 F (36.7 C), temperature source Oral, resp. rate 18, last menstrual period 03/18/2022, SpO2 100 %. There is no height or weight on file to calculate BMI.  Past Psychiatric History: Depression, ADHD, schizoaffective, anxiety.  Is the patient at risk to self? Yes  Has the patient been a risk to self in the past 6 months? Yes .    Has the patient been a risk to self within the distant past? Yes   Is the patient a risk to others? No   Has the patient been a risk to others in the past 6 months? No   Has the patient been a risk to others within the distant past? No   Past Medical History:  Past Medical History:  Diagnosis Date   Anxiety    Asthma    age 5   Bipolar disorder (HCC)    Eczema    Migraines     Past Surgical History:  Procedure Laterality Date   CHOLECYSTECTOMY N/A 01/03/2022   Procedure: LAPAROSCOPIC CHOLECYSTECTOMY WITH INTRAOPERATIVE CHOLANGIOGRAM;  Surgeon: Manus Rudd, MD;  Location: [facility] OR;   Service: General;  Laterality: N/A;   KNEE SURGERY  01/23/2015   L knee torn meniscus and ACL   TONSILLECTOMY AND ADENOIDECTOMY  2012    Family History:  Family History  Problem Relation Age of Onset   Alcohol abuse Mother    Depression Mother    Thyroid disease Mother    Heart attack Mother 41       died of heart attack   Bipolar disorder Mother    Asthma Mother    Vision loss Sister    Asthma Father        as a child   Hypertension Maternal Grandfather     Social History:  Social History   Socioeconomic History   Marital status: Significant Other    Spouse name: Not on file   Number of children: Not on file   Years of education: Not on file   Highest education level: Not on file  Occupational History   Occupation: Consulting civil engineer  Tobacco Use   Smoking status: Former    Types: Cigarettes    Quit date: 03/11/2022    Years since quitting: 0.0   Smokeless tobacco: Never  Vaping Use   Vaping Use: Former   Quit date: 03/11/2022  Substance and Sexual Activity   Alcohol use: Not Currently   Drug use: Yes    Frequency: 7.0 times per week    Types: Marijuana    Comment: .5 gm per day   Sexual activity: Not Currently    Comment: depo before  Other Topics Concern   Not on file  Social History Narrative   Lives with aunt (who has custody), cat and dog   Social Determinants of Health   Financial Resource Strain: Not on file  Food Insecurity: Not on file  Transportation Needs: Not on file  Physical Activity: Not on file  Stress: Not on file  Social Connections: Not on file  Intimate Partner Violence: Not on file    SDOH:  SDOH Screenings   Alcohol Screen: Low Risk    Last Alcohol Screening Score (AUDIT): 4  Depression (PHQ2-9): Not on file  Financial Resource Strain: Not on file  Food Insecurity: Not on file  Housing: Not on file  Physical Activity: Not on file  Social Connections: Not on file  Stress: Not on file  Tobacco Use: Medium Risk   Smoking Tobacco  Use: Former   Smokeless Tobacco Use: Never   Passive Exposure: Not on file  Transportation Needs: Not on file    Last Labs:  Admission on 03/18/2022, Discharged on 03/19/2022  Component Date Value Ref Range Status   Sodium 03/18/2022 140  135 - 145 mmol/L Final   Potassium 03/18/2022 3.6  3.5 - 5.1 mmol/L Final   Chloride 03/18/2022 107  98 - 111 mmol/L Final   CO2 03/18/2022 26  22 - 32 mmol/L Final   Glucose, Bld 03/18/2022 90  70 - 99 mg/dL Final   Glucose reference range applies only to samples taken after fasting for at least 8 hours.   BUN 03/18/2022 11  6 - 20 mg/dL Final   Creatinine, Ser 03/18/2022 0.62  0.44 - 1.00 mg/dL Final   Calcium 08/65/784605/29/2023 8.8 (L)  8.9 - 10.3 mg/dL Final   Total Protein 96/29/528405/29/2023 6.6  6.5 - 8.1 g/dL Final   Albumin 13/24/401005/29/2023 4.1  3.5 - 5.0 g/dL Final   AST 27/25/366405/29/2023 11 (L)  15 - 41 U/L Final   ALT 03/18/2022 <5  0 - 44 U/L Final   Alkaline Phosphatase 03/18/2022 51  38 - 126 U/L Final   Total Bilirubin 03/18/2022 0.2 (L)  0.3 - 1.2 mg/dL Final   GFR, Estimated 03/18/2022 >60  >60 mL/min Final   Comment: (NOTE) Calculated using the CKD-EPI Creatinine Equation (2021)    Anion gap 03/18/2022 7  5 - 15 Final   Performed at [facility], [address], [place], [place] [address]   Alcohol, Ethyl (B) 03/18/2022 10 (H)  <10 mg/dL Final   Comment: (NOTE) Lowest detectable limit for serum alcohol is 10 mg/dL.  For medical purposes only. Performed at [facility], [address], [place], [place] [address]    Salicylate Lvl 03/18/2022 <7.0 (L)  7.0 - 30.0 mg/dL Final   Performed at [facility], [address], [place], [place] [address]   Acetaminophen (Tylenol), Serum 03/18/2022 <10 (L)  10 - 30 ug/mL Final   Comment: (NOTE) Therapeutic concentrations vary significantly. A range of  10-30 ug/mL  may be an effective concentration for many patients. However, some  are best treated at  concentrations outside of this range. Acetaminophen concentrations >150 ug/mL at 4 hours after ingestion  and >50 ug/mL at 12 hours after ingestion are often associated with  toxic reactions.  Performed at [facility], [address] [place], [place] [address]    WBC 03/18/2022 7.5  4.0 - 10.5 K/uL Final   RBC 03/18/2022 3.99  3.87 - 5.11 MIL/uL Final   Hemoglobin 03/18/2022 12.4  12.0 - 15.0 g/dL Final   HCT 60/45/4098 36.5  36.0 - 46.0 % Final   MCV 03/18/2022 91.5  80.0 - 100.0 fL Final   MCH 03/18/2022 31.1  26.0 - 34.0 pg Final   MCHC 03/18/2022 34.0  30.0 - 36.0 g/dL Final   RDW 11/91/4782 12.5  11.5 - 15.5 % Final   Platelets 03/18/2022 286  150 - 400 K/uL Final   nRBC 03/18/2022 0.0  0.0 - 0.2 % Final   Performed at [facility], [address] [place], [place] [address]   Opiates 03/18/2022 POSITIVE (A)  NONE DETECTED Final   Cocaine 03/18/2022 NONE DETECTED  NONE DETECTED Final   Benzodiazepines 03/18/2022 NONE DETECTED  NONE DETECTED Final   Amphetamines 03/18/2022 NONE DETECTED  NONE DETECTED Final   Tetrahydrocannabinol 03/18/2022 POSITIVE (A)  NONE DETECTED Final   Barbiturates 03/18/2022 NONE DETECTED  NONE DETECTED Final   Comment: (NOTE) DRUG SCREEN FOR MEDICAL PURPOSES ONLY.  IF CONFIRMATION IS NEEDED FOR ANY PURPOSE, NOTIFY LAB WITHIN 5 DAYS.  LOWEST DETECTABLE LIMITS FOR URINE DRUG SCREEN Drug Class                     Cutoff (ng/mL) Amphetamine and metabolites    1000 Barbiturate and metabolites    200 Benzodiazepine                 200 Tricyclics and metabolites     300 Opiates and metabolites        300 Cocaine and metabolites        300 THC                            50 Performed at [facility], [address] [place], [place] [address]    Preg Test, Ur 03/18/2022 NEGATIVE  NEGATIVE Final   Comment:        THE SENSITIVITY OF THIS METHODOLOGY IS >20 mIU/mL. Performed at [facility], [address] [place], [place] [address]    SARS Coronavirus 2 by RT PCR 03/18/2022 NEGATIVE  NEGATIVE Final   Comment: (NOTE) SARS-CoV-2 target nucleic acids are NOT DETECTED.  The SARS-CoV-2 RNA is generally detectable in upper respiratory specimens during the acute phase of infection. The lowest concentration of SARS-CoV-2 viral copies this assay can detect is 138 copies/mL. A negative result does not preclude SARS-Cov-2 infection and should not be used as the sole basis for treatment or other patient management decisions. A negative result may occur with  improper specimen collection/handling, submission of specimen other than nasopharyngeal swab, presence of viral mutation(s) within the areas targeted by this assay, and inadequate number of viral copies(<138 copies/mL). A negative result must be combined with clinical observations, patient history, and epidemiological information. The expected result is Negative.  Fact Sheet for Patients:  BloggerCourse.com  Fact Sheet for Healthcare Providers:  SeriousBroker.it  This test is  no                          t yet approved or cleared by the [place] and  has been authorized for detection and/or diagnosis of SARS-CoV-2 by FDA under an Emergency Use Authorization (EUA). This EUA will remain  in effect (meaning this test can be used) for the duration of the COVID-19 declaration under Section 564(b)(1) of the Act, 21 U.S.C.section 360bbb-3(b)(1), unless the authorization is terminated  or revoked sooner.       Influenza A by PCR 03/18/2022 NEGATIVE  NEGATIVE Final   Influenza B by PCR 03/18/2022 NEGATIVE  NEGATIVE Final   Comment: (NOTE) The Xpert Xpress SARS-CoV-2/FLU/RSV plus assay is intended as an aid in the diagnosis of influenza from Nasopharyngeal swab specimens and should not be used as a sole basis for treatment. Nasal washings  and aspirates are unacceptable for Xpert Xpress SARS-CoV-2/FLU/RSV testing.  Fact Sheet for Patients: BloggerCourse.com  Fact Sheet for Healthcare Providers: SeriousBroker.it  This test is not yet approved or cleared by the [place] FDA and has been authorized for detection and/or diagnosis of SARS-CoV-2 by FDA under an Emergency Use Authorization (EUA). This EUA will remain in effect (meaning this test can be used) for the duration of the COVID-19 declaration under Section 564(b)(1) of the Act, 21 U.S.C. section 360bbb-3(b)(1), unless the authorization is terminated or revoked.  Performed at [facility], [address], [place], [place] [address]   Admission on 02/23/2022, Discharged on 02/28/2022  Component Date Value Ref Range Status   Cholesterol 02/24/2022 206 (H)  0 - 200 mg/dL Final   Triglycerides 60/45/4098 88  <150 mg/dL Final   HDL 11/91/4782 59  >40 mg/dL Final   Total CHOL/HDL Ratio 02/24/2022 3.5  RATIO Final   VLDL 02/24/2022 18  0 - 40 mg/dL Final   LDL Cholesterol 02/24/2022 129 (H)  0 - 99 mg/dL Final   Comment:        Total Cholesterol/HDL:CHD Risk Coronary Heart Disease Risk Table                     Men   Women  1/2 Average Risk   3.4   3.3  Average Risk       5.0   4.4  2 X Average Risk   9.6   7.1  3 X Average Risk  23.4   11.0        Use the calculated Patient Ratio above and the CHD Risk Table to determine the patient's CHD Risk.        ATP III CLASSIFICATION (LDL):  <100     mg/dL   Optimal  956-213  mg/dL   Near or Above                    Optimal  130-159  mg/dL   Borderline  086-578  mg/dL   High  >469     mg/dL   Very High Performed at [facility], [address], [place], [place] [address]    Hgb A1c MFr Bld 02/24/2022 5.1  4.8 - 5.6 % Final   Comment: (NOTE) Pre diabetes:          5.7%-6.4%  Diabetes:              >6.4%  Glycemic  control for   <7.0% adults with diabetes    Mean Plasma  Glucose 02/24/2022 99.67  mg/dL Final   Performed at [facility], [address] [place], [place] [address]   TSH 02/24/2022 0.589  0.350 - 4.500 uIU/mL Final   Comment: Performed by a 3rd Generation assay with a functional sensitivity of <=0.01 uIU/mL. Performed at [facility], [address], [place], [place] [address]    Vit D, 25-Hydroxy 02/24/2022 57.65  30 - 100 ng/mL Final   Comment: (NOTE) Vitamin D deficiency has been defined by the Institute of Medicine  and an Endocrine Society practice guideline as a level of serum 25-OH  vitamin D less than 20 ng/mL (1,2). The Endocrine Society went on to  further define vitamin D insufficiency as a level between 21 and 29  ng/mL (2).  1. IOM (Institute of Medicine). 2010. Dietary reference intakes for  calcium and D. [place]: [employer]. 2. Holick MF, Binkley [place], Bischoff-Ferrari HA, et al. Evaluation,  treatment, and prevention of vitamin D deficiency: an Endocrine  Society clinical practice guideline, JCEM. 2011 Jul; 96(7): 1911-30.  Performed at [facility], [address], [place], [place] [address]    Vitamin B1 (Thiamine) 02/24/2022 134.2  66.5 - 200.0 nmol/L Final   Comment: (NOTE) This test was developed and its performance characteristics determined by [facility]. It has not been cleared or approved by the Food and Drug Administration. Performed At: [facility] [address] [place], [place] [id number] Jolene Schimke MD QI:[id number]   Admission on 02/22/2022, Discharged on 02/23/2022  Component Date Value Ref Range Status   Sodium 02/22/2022 141  135 - 145 mmol/L Final   Potassium 02/22/2022 3.6  3.5 - 5.1 mmol/L Final   Chloride 02/22/2022 110  98 - 111 mmol/L Final   CO2 02/22/2022 24  22 - 32 mmol/L Final   Glucose, Bld 02/22/2022 101 (H)  70 - 99 mg/dL Final   Glucose reference range applies  only to samples taken after fasting for at least 8 hours.   BUN 02/22/2022 11  6 - 20 mg/dL Final   Creatinine, Ser 02/22/2022 0.62  0.44 - 1.00 mg/dL Final   Calcium 32/44/0102 8.6 (L)  8.9 - 10.3 mg/dL Final   Total Protein 72/53/6644 7.1  6.5 - 8.1 g/dL Final   Albumin 03/47/4259 4.0  3.5 - 5.0 g/dL Final   AST 56/38/7564 16  15 - 41 U/L Final   ALT 02/22/2022 8  0 - 44 U/L Final   Alkaline Phosphatase 02/22/2022 62  38 - 126 U/L Final   Total Bilirubin 02/22/2022 0.6  0.3 - 1.2 mg/dL Final   GFR, Estimated 02/22/2022 >60  >60 mL/min Final   Comment: (NOTE) Calculated using the CKD-EPI Creatinine Equation (2021)    Anion gap 02/22/2022 7  5 - 15 Final   Performed at [facility], [address], [place], [place] [address]   Alcohol, Ethyl (B) 02/22/2022 <10  <10 mg/dL Final   Comment: (NOTE) Lowest detectable limit for serum alcohol is 10 mg/dL.  For medical purposes only. Performed at [facility], [address] [place], [place] [address]    Salicylate Lvl 02/22/2022 <7.0 (L)  7.0 - 30.0 mg/dL Final   Performed at [facility], [address], [place], [place] [address]   Acetaminophen (Tylenol), Serum 02/22/2022 <10 (L)  10 - 30 ug/mL Final   Comment: (NOTE) Therapeutic concentrations vary significantly. A range of 10-30 ug/mL  may be an effective  concentration for many patients. However, some  are best treated at concentrations outside of this range. Acetaminophen concentrations >150 ug/mL at 4 hours after ingestion  and >50 ug/mL at 12 hours after ingestion are often associated with  toxic reactions.  Performed at [facility], [address] [place], [place] [address]    WBC 02/22/2022 11.3 (H)  4.0 - 10.5 K/uL Final   RBC 02/22/2022 4.16  3.87 - 5.11 MIL/uL Final   Hemoglobin 02/22/2022 13.3  12.0 - 15.0 g/dL Final   HCT 62/22/9798 38.0  36.0 - 46.0 % Final   MCV 02/22/2022 91.3  80.0 -  100.0 fL Final   MCH 02/22/2022 32.0  26.0 - 34.0 pg Final   MCHC 02/22/2022 35.0  30.0 - 36.0 g/dL Final   RDW 92/08/9416 12.8  11.5 - 15.5 % Final   Platelets 02/22/2022 341  150 - 400 K/uL Final   nRBC 02/22/2022 0.0  0.0 - 0.2 % Final   Performed at [facility], [address] [place], [place] [address]   Opiates 02/22/2022 POSITIVE (A)  NONE DETECTED Final   Cocaine 02/22/2022 NONE DETECTED  NONE DETECTED Final   Benzodiazepines 02/22/2022 NONE DETECTED  NONE DETECTED Final   Amphetamines 02/22/2022 POSITIVE (A)  NONE DETECTED Final   Tetrahydrocannabinol 02/22/2022 POSITIVE (A)  NONE DETECTED Final   Barbiturates 02/22/2022 NONE DETECTED  NONE DETECTED Final   Comment: (NOTE) DRUG SCREEN FOR MEDICAL PURPOSES ONLY.  IF CONFIRMATION IS NEEDED FOR ANY PURPOSE, NOTIFY LAB WITHIN 5 DAYS.  LOWEST DETECTABLE LIMITS FOR URINE DRUG SCREEN Drug Class                     Cutoff (ng/mL) Amphetamine and metabolites    1000 Barbiturate and metabolites    200 Benzodiazepine                 200 Tricyclics and metabolites     300 Opiates and metabolites        300 Cocaine and metabolites        300 THC                            50 Performed at [facility], [address], [place], [place] [address]    I-stat hCG, quantitative 02/22/2022 <5.0  <5 mIU/mL Final   Comment 3 02/22/2022          Final   Comment:   GEST. AGE      CONC.  (mIU/mL)   <=1 WEEK        5 - 50     2 WEEKS       50 - 500     3 WEEKS       100 - 10,000     4 WEEKS     1,000 - 30,000        FEMALE AND NON-PREGNANT FEMALE:     LESS THAN 5 mIU/mL    SARS Coronavirus 2 by RT PCR 02/23/2022 NEGATIVE  NEGATIVE Final   Comment: (NOTE) SARS-CoV-2 target nucleic acids are NOT DETECTED.  The SARS-CoV-2 RNA is generally detectable in upper respiratory specimens during the acute phase of infection. The lowest concentration of SARS-CoV-2 viral copies this assay can detect is 138  copies/mL. A negative result does not preclude SARS-Cov-2 infection and should not be used as the sole basis for treatment or other patient management  decisions. A negative result may occur with  improper specimen collection/handling, submission of specimen other than nasopharyngeal swab, presence of viral mutation(s) within the areas targeted by this assay, and inadequate number of viral copies(<138 copies/mL). A negative result must be combined with clinical observations, patient history, and epidemiological information. The expected result is Negative.  Fact Sheet for Patients:  BloggerCourse.com  Fact Sheet for Healthcare Providers:  SeriousBroker.it  This test is no                          t yet approved or cleared by the [place] FDA and  has been authorized for detection and/or diagnosis of SARS-CoV-2 by FDA under an Emergency Use Authorization (EUA). This EUA will remain  in effect (meaning this test can be used) for the duration of the COVID-19 declaration under Section 564(b)(1) of the Act, 21 U.S.C.section 360bbb-3(b)(1), unless the authorization is terminated  or revoked sooner.       Influenza A by PCR 02/23/2022 NEGATIVE  NEGATIVE Final   Influenza B by PCR 02/23/2022 NEGATIVE  NEGATIVE Final   Comment: (NOTE) The Xpert Xpress SARS-CoV-2/FLU/RSV plus assay is intended as an aid in the diagnosis of influenza from Nasopharyngeal swab specimens and should not be used as a sole basis for treatment. Nasal washings and aspirates are unacceptable for Xpert Xpress SARS-CoV-2/FLU/RSV testing.  Fact Sheet for Patients: BloggerCourse.com  Fact Sheet for Healthcare Providers: SeriousBroker.it  This test is not yet approved or cleared by the [place] FDA and has been authorized for detection and/or diagnosis of SARS-CoV-2 by FDA under an Emergency Use Authorization  (EUA). This EUA will remain in effect (meaning this test can be used) for the duration of the COVID-19 declaration under Section 564(b)(1) of the Act, 21 U.S.C. section 360bbb-3(b)(1), unless the authorization is terminated or revoked.  Performed at [facility], [address], [place], [place] [address]   Admission on 01/02/2022, Discharged on 01/04/2022  Component Date Value Ref Range Status   WBC 01/02/2022 7.8  4.0 - 10.5 K/uL Final   RBC 01/02/2022 4.37  3.87 - 5.11 MIL/uL Final   Hemoglobin 01/02/2022 13.5  12.0 - 15.0 g/dL Final   HCT 60/45/4098 39.4  36.0 - 46.0 % Final   MCV 01/02/2022 90.2  80.0 - 100.0 fL Final   MCH 01/02/2022 30.9  26.0 - 34.0 pg Final   MCHC 01/02/2022 34.3  30.0 - 36.0 g/dL Final   RDW 11/91/4782 12.7  11.5 - 15.5 % Final   Platelets 01/02/2022 200  150 - 400 K/uL Final   nRBC 01/02/2022 0.0  0.0 - 0.2 % Final   Performed at [facility], [address] [place], [place] [address]   Sodium 01/02/2022 141  135 - 145 mmol/L Final   Potassium 01/02/2022 3.9  3.5 - 5.1 mmol/L Final   Chloride 01/02/2022 108  98 - 111 mmol/L Final   CO2 01/02/2022 25  22 - 32 mmol/L Final   Glucose, Bld 01/02/2022 86  70 - 99 mg/dL Final   Glucose reference range applies only to samples taken after fasting for at least 8 hours.   BUN 01/02/2022 8  6 - 20 mg/dL Final   Creatinine, Ser 01/02/2022 0.59  0.44 - 1.00 mg/dL Final   Calcium 30/86/5784 8.8 (L)  8.9 - 10.3 mg/dL Final   Total Protein 69/62/9528 6.4 (L)  6.5 - 8.1 g/dL Final   Albumin 41/32/4401 4.0  3.5 - 5.0 g/dL Final   AST 91/47/8295 12 (L)  15 - 41 U/L Final   ALT 01/02/2022 <5  0 - 44 U/L Final   Alkaline Phosphatase 01/02/2022 57  38 - 126 U/L Final   Total Bilirubin 01/02/2022 0.3  0.3 - 1.2 mg/dL Final   GFR, Estimated 01/02/2022 >60  >60 mL/min Final   Comment: (NOTE) Calculated using the CKD-EPI Creatinine Equation (2021)    Anion gap 01/02/2022 8  5 - 15  Final   Performed at [facility], [address], [place], [place] [address]   Lipase 01/02/2022 31  11 - 51 U/L Final   Performed at [facility], [address], [place], [place] [address]   SARS Coronavirus 2 by RT PCR 01/02/2022 NEGATIVE  NEGATIVE Final   Comment: (NOTE) SARS-CoV-2 target nucleic acids are NOT DETECTED.  The SARS-CoV-2 RNA is generally detectable in upper respiratory specimens during the acute phase of infection. The lowest concentration of SARS-CoV-2 viral copies this assay can detect is 138 copies/mL. A negative result does not preclude SARS-Cov-2 infection and should not be used as the sole basis for treatment or other patient management decisions. A negative result may occur with  improper specimen collection/handling, submission of specimen other than nasopharyngeal swab, presence of viral mutation(s) within the areas targeted by this assay, and inadequate number of viral copies(<138 copies/mL). A negative result must be combined with clinical observations, patient history, and epidemiological information. The expected result is Negative.  Fact Sheet for Patients:  BloggerCourse.com  Fact Sheet for Healthcare Providers:  SeriousBroker.it  This test is no                          t yet approved or cleared by the [place] FDA and  has been authorized for detection and/or diagnosis of SARS-CoV-2 by FDA under an Emergency Use Authorization (EUA). This EUA will remain  in effect (meaning this test can be used) for the duration of the COVID-19 declaration under Section 564(b)(1) of the Act, 21 U.S.C.section 360bbb-3(b)(1), unless the authorization is terminated  or revoked sooner.       Influenza A by PCR 01/02/2022 NEGATIVE  NEGATIVE Final   Influenza B by PCR 01/02/2022 NEGATIVE  NEGATIVE Final   Comment: (NOTE) The Xpert Xpress SARS-CoV-2/FLU/RSV plus assay is  intended as an aid in the diagnosis of influenza from Nasopharyngeal swab specimens and should not be used as a sole basis for treatment. Nasal washings and aspirates are unacceptable for Xpert Xpress SARS-CoV-2/FLU/RSV testing.  Fact Sheet for Patients: BloggerCourse.com  Fact Sheet for Healthcare Providers: SeriousBroker.it  This test is not yet approved or cleared by the [place] FDA and has been authorized for detection and/or diagnosis of SARS-CoV-2 by FDA under an Emergency Use Authorization (EUA). This EUA will remain in effect (meaning this test can be used) for the duration of the COVID-19 declaration under Section 564(b)(1) of the Act, 21 U.S.C. section 360bbb-3(b)(1), unless the authorization is terminated or revoked.  Performed at [facility], [address], [place], [place] [address]    HIV Screen 4th Generation wRfx 01/03/2022 Non Reactive  Non Reactive Final   Performed at [facility], [address] [place], [place] [address]   SURGICAL PATHOLOGY 01/03/2022    Final-Edited                   Value:SURGICAL PATHOLOGY CASE: [id number] PATIENT: Ruchi  [facility] Surgical Pathology Report     Clinical History: acute cholecystitis (cm)     FINAL MICROSCOPIC DIAGNOSIS:  A. GALLBLADDER, CHOLECYSTECTOMY: Chronic cholecystitis. Cholelithiasis. Reactive lymphadenitis.  GROSS DESCRIPTION:  Size/?Intact: An intact gallbladder measuring 8.0 x 4.2 x 3.8 cm Serosal surface: Pink-purple, smooth, hyperemic Mucosa/Wall: Mucosa is tan-yellow and slightly granular, and the wall measures up to 0.4 cm in thickness Contents: Gallbladder contains a moderate amount of green-yellow bile, and multiple tan-yellow, multifaceted calculi ranging from 1.0 to 1.2 cm in greatest dimension Cystic duct: 0.1 cm in diameter.  Adjacent to the neck of the gallbladder there is a 1.3 x 1.0 x 0.8 cm  tan-pink possible lymph node identified. Block Summary: 1 block submitted  Lovey Newcomer 01/03/2022)    Final Diagnosis performed by Henri Medal, MD.   Electronically signed 01/04/2022 Technical and                          / or Professional components performed at [facility]. [facility], [address] [place], [place] [address].  Immunohistochemistry Technical component (if applicable) was performed at [facility]. [address], [address], [place], [place] [address].   IMMUNOHISTOCHEMISTRY DISCLAIMER (if applicable): Some of these immunohistochemical stains may have been developed and the performance characteristics determine by [facility]. Some may not have been cleared or approved by the U.S. Food and Drug Administration. The FDA has determined that such clearance or approval is not necessary. This test is used for clinical purposes. It should not be regarded as investigational or for research. This laboratory is certified under the Clinical Laboratory Improvement Amendments of 1988 (CLIA-88) as qualified to perform high complexity clinical laboratory testing.  The controls stained appropriately.    Sodium 01/04/2022 138  135 - 145 mmol/L Final   Potassium 01/04/2022 3.6  3.5 - 5.1 mmol/L Final   Chloride 01/04/2022 109  98 - 111 mmol/L Final   CO2 01/04/2022 21 (L)  22 - 32 mmol/L Final   Glucose, Bld 01/04/2022 86  70 - 99 mg/dL Final   Glucose reference range applies only to samples taken after fasting for at least 8 hours.   BUN 01/04/2022 5 (L)  6 - 20 mg/dL Final   Creatinine, Ser 01/04/2022 0.64  0.44 - 1.00 mg/dL Final   Calcium 69/62/9528 8.3 (L)  8.9 - 10.3 mg/dL Final   Total Protein 41/32/4401 5.1 (L)  6.5 - 8.1 g/dL Final   Albumin 02/72/5366 2.9 (L)  3.5 - 5.0 g/dL Final   AST 44/12/4740 41  15 - 41 U/L Final   ALT 01/04/2022 30  0 - 44 U/L Final   Alkaline Phosphatase 01/04/2022 54  38 - 126 U/L Final   Total Bilirubin 01/04/2022  0.7  0.3 - 1.2 mg/dL Final   GFR, Estimated 01/04/2022 >60  >60 mL/min Final   Comment: (NOTE) Calculated using the CKD-EPI Creatinine Equation (2021)    Anion gap 01/04/2022 8  5 - 15 Final   Performed at [facility], [address] [place], [place] [address]   WBC 01/04/2022 10.8 (H)  4.0 - 10.5 K/uL Final   RBC 01/04/2022 3.63 (L)  3.87 - 5.11 MIL/uL Final   Hemoglobin 01/04/2022 11.1 (L)  12.0 - 15.0 g/dL Final   HCT 87/56/4332 32.8 (L)  36.0 - 46.0 % Final   MCV 01/04/2022 90.4  80.0 - 100.0 fL Final   MCH 01/04/2022 30.6  26.0 -  34.0 pg Final   MCHC 01/04/2022 33.8  30.0 - 36.0 g/dL Final   RDW 78/46/9629 12.4  11.5 - 15.5 % Final   Platelets 01/04/2022 185  150 - 400 K/uL Final   nRBC 01/04/2022 0.0  0.0 - 0.2 % Final   Performed at [facility], [address] [place], [place] [address]  Admission on 01/01/2022, Discharged on 01/02/2022  Component Date Value Ref Range Status   Lipase 01/01/2022 20  11 - 51 U/L Final   Performed at [facility], [address] [place], [place] [address]   Sodium 01/01/2022 140  135 - 145 mmol/L Final   Potassium 01/01/2022 3.3 (L)  3.5 - 5.1 mmol/L Final   Chloride 01/01/2022 106  98 - 111 mmol/L Final   CO2 01/01/2022 25  22 - 32 mmol/L Final   Glucose, Bld 01/01/2022 87  70 - 99 mg/dL Final   Glucose reference range applies only to samples taken after fasting for at least 8 hours.   BUN 01/01/2022 8  6 - 20 mg/dL Final   Creatinine, Ser 01/01/2022 0.63  0.44 - 1.00 mg/dL Final   Calcium 08/17/2535 9.1  8.9 - 10.3 mg/dL Final   Total Protein 64/40/3474 6.5  6.5 - 8.1 g/dL Final   Albumin 25/95/6387 4.3  3.5 - 5.0 g/dL Final   AST 56/43/3295 13 (L)  15 - 41 U/L Final   ALT 01/01/2022 5  0 - 44 U/L Final   Alkaline Phosphatase 01/01/2022 54  38 - 126 U/L Final   Total Bilirubin 01/01/2022 0.4  0.3 - 1.2 mg/dL Final   GFR, Estimated 01/01/2022 >60  >60 mL/min Final   Comment: (NOTE) Calculated using the  CKD-EPI Creatinine Equation (2021)    Anion gap 01/01/2022 9  5 - 15 Final   Performed at [facility], [address], [place], [place] [address]   WBC 01/01/2022 12.5 (H)  4.0 - 10.5 K/uL Final   RBC 01/01/2022 4.23  3.87 - 5.11 MIL/uL Final   Hemoglobin 01/01/2022 13.0  12.0 - 15.0 g/dL Final   HCT 66/03/3015 37.6  36.0 - 46.0 % Final   MCV 01/01/2022 88.9  80.0 - 100.0 fL Final   MCH 01/01/2022 30.7  26.0 - 34.0 pg Final   MCHC 01/01/2022 34.6  30.0 - 36.0 g/dL Final   RDW 10/29/3233 12.6  11.5 - 15.5 % Final   Platelets 01/01/2022 239  150 - 400 K/uL Final   nRBC 01/01/2022 0.0  0.0 - 0.2 % Final   Performed at [facility], [address], [place], [place] [address]   Color, Urine 01/01/2022 YELLOW  YELLOW Final   APPearance 01/01/2022 CLEAR  CLEAR Final   Specific Gravity, Urine 01/01/2022 1.038 (H)  1.005 - 1.030 Final   pH 01/01/2022 6.0  5.0 - 8.0 Final   Glucose, UA 01/01/2022 NEGATIVE  NEGATIVE mg/dL Final   Hgb urine dipstick 01/01/2022 NEGATIVE  NEGATIVE Final   Bilirubin Urine 01/01/2022 NEGATIVE  NEGATIVE Final   Ketones, ur 01/01/2022 TRACE (A)  NEGATIVE mg/dL Final   Protein, ur 02/54/2706 30 (A)  NEGATIVE mg/dL Final   Nitrite 23/76/2831 NEGATIVE  NEGATIVE Final   Leukocytes,Ua 01/01/2022 SMALL (A)  NEGATIVE Final   RBC / HPF 01/01/2022 0-5  0 - 5 RBC/hpf Final   WBC, UA 01/01/2022 0-5  0 - 5 WBC/hpf Final   Bacteria, UA 01/01/2022 RARE (A)  NONE SEEN Final   Squamous Epithelial /  LPF 01/01/2022 6-10  0 - 5 Final   Mucus 01/01/2022 PRESENT   Final   Hyaline Casts, UA 01/01/2022 PRESENT   Final   Performed at [facility], [address] [place], [place] [address]   Preg Test, Ur 01/01/2022 NEGATIVE  NEGATIVE Final   Comment:        THE SENSITIVITY OF THIS METHODOLOGY IS >20 mIU/mL. Performed at [facility], [address], [place], [place] [address]     Allergies: Kiwi  extract; Mobic [meloxicam]; and Antihistamines, diphenhydramine-type  PTA Medications: (Not in a hospital admission)   Medical Decision Making  Inpatient observation   Meds ordered this encounter  Medications   acetaminophen (TYLENOL) tablet 650 mg   alum & mag hydroxide-simeth (MAALOX/MYLANTA) 200-200-20 MG/5ML suspension 30 mL   magnesium hydroxide (MILK OF MAGNESIA) suspension 30 mL    Lab Orders         Resp Panel by RT-PCR (Flu A&B, Covid) Anterior Nasal Swab      Recommendations  Based on my evaluation the patient does not appear to have an emergency medical condition.  Sindy Guadeloupe, NP 03/19/22  5:36 AM

## 2022-03-19 NOTE — ED Notes (Signed)
Pt resting quietly.  Continues to be in view of nursing.  Staff will continue to monitor for safety.
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
## 2022-03-19 NOTE — Discharge Instructions (Addendum)
Please paste into her AVS: "You are scheduled for an assessment for the PHP on Thursday, 6/1 at 1:00 pm. This appointment will last approximately one hour and will be virtual via Webex. PHP is virtual group therapy that runs Mon-Fri from 9am-1pm. Please download the [employer] app prior to the appointment. If you need to cancel or reschedule, please call [phone]."      The suicide prevention education provided includes the following: Suicide risk factors Suicide prevention and interventions National Suicide Hotline telephone number [facility] assessment telephone number [facility] Emergency Assistance 911 [facility] and/or Residential Mobile Crisis Unit telephone number   Request made of family/significant other to: Remove weapons (e.g., guns, rifles, knives), all items previously/currently identified as safety concern.   Remove drugs/medications (over the counter, prescriptions, illicit drugs), all items previously/currently identified as a safety concern.

## 2022-03-21 ENCOUNTER — Ambulatory Visit (HOSPITAL_COMMUNITY): Payer: Medicaid Other | Admitting: Licensed Clinical Social Worker

## 2022-03-21 DIAGNOSIS — F332 Major depressive disorder, recurrent severe without psychotic features: Secondary | ICD-10-CM
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
## 2022-03-26 ENCOUNTER — Telehealth (HOSPITAL_COMMUNITY): Payer: Self-pay | Admitting: Licensed Clinical Social Worker

## 2022-03-27 ENCOUNTER — Other Ambulatory Visit (HOSPITAL_BASED_OUTPATIENT_CLINIC_OR_DEPARTMENT_OTHER): Payer: Self-pay

## 2022-03-27 ENCOUNTER — Encounter (HOSPITAL_BASED_OUTPATIENT_CLINIC_OR_DEPARTMENT_OTHER): Payer: Self-pay | Admitting: Pharmacist

## 2022-03-27 MED ORDER — NORTRIPTYLINE HCL 25 MG PO CAPS
ORAL_CAPSULE | ORAL | 1 refills | Status: AC
  Filled 2022-03-27: qty 30, 30d supply, fill #0

## 2022-03-27 MED ORDER — ARIPIPRAZOLE 15 MG PO TABS
ORAL_TABLET | ORAL | 1 refills | Status: AC
  Filled 2022-03-27: qty 30, 30d supply, fill #0
  Filled 2022-05-17: qty 30, 30d supply, fill #1

## 2022-03-27 MED ORDER — AMPHETAMINE-DEXTROAMPHETAMINE 20 MG PO TABS
ORAL_TABLET | ORAL | 0 refills | Status: AC
  Filled 2022-03-27: qty 31, 16d supply, fill #0
  Filled 2022-03-28: qty 29, 14d supply, fill #0

## 2022-03-28 ENCOUNTER — Ambulatory Visit (INDEPENDENT_AMBULATORY_CARE_PROVIDER_SITE_OTHER): Payer: Medicaid Other | Admitting: Licensed Clinical Social Worker

## 2022-03-28 ENCOUNTER — Other Ambulatory Visit (HOSPITAL_BASED_OUTPATIENT_CLINIC_OR_DEPARTMENT_OTHER): Payer: Self-pay

## 2022-03-28 ENCOUNTER — Encounter (HOSPITAL_COMMUNITY): Payer: Self-pay

## 2022-03-28 DIAGNOSIS — F332 Major depressive disorder, recurrent severe without psychotic features: Secondary | ICD-10-CM

## 2022-03-28 MED ORDER — VITAMIN D3 1.25 MG (50000 UT) PO CAPS
1.0000 | ORAL_CAPSULE | ORAL | 0 refills | Status: AC
  Filled 2022-03-28: qty 12, 84d supply, fill #0

## 2022-03-28 NOTE — Progress Notes (Signed)
   03/21/22 1412  Patient-Centered Profile  PCP Completed On 03/21/22  Plan Meeting Date 03/21/22  Effective Date 03/21/22  What People Like and [place] About? "My compassion and my empathy."  What's Important to? "My family and friends and pets."  How Best to Support? Pt will attend PHP Monday through Friday for 2-3 weeks. "Getting therapy. I do feel that I need intense therapy. I feel like I need to be pushed. I have lots of trauma. It's hard to talk about. I do better when I can relate to others."  Add What's Working / [employer] Not Working? "I don't really know. I just talk. We don't really talk about specifics. I feel like I need some direction."  PCP Action Plan  Long Range Outcome Pt will report increased ability to manage depression anxiety symptoms using coping skills at least 3x a day. Pt will report decrease in passive SI to at least no more than 2x a week. Pt will report increased ability to catch, challenge, change cognitive distortions at least 3x a day.
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
## 2022-03-29 ENCOUNTER — Ambulatory Visit (INDEPENDENT_AMBULATORY_CARE_PROVIDER_SITE_OTHER): Payer: Medicaid Other | Admitting: Licensed Clinical Social Worker

## 2022-03-29 DIAGNOSIS — F332 Major depressive disorder, recurrent severe without psychotic features: Secondary | ICD-10-CM | POA: Diagnosis not present

## 2022-03-31 NOTE — Progress Notes (Signed)
Virtual Visit via Video Note  I connected with Anita [name] on 03/31/22 at  9:00 AM EDT by a video enabled telemedicine application and verified that I am speaking with the correct person using two identifiers.  Location: Patient: Home Provider: office   I discussed the limitations of evaluation and management by telemedicine and the availability of in person appointments. The patient expressed understanding and agreed to proceed.  I discussed the assessment and treatment plan with the patient. The patient was provided an opportunity to ask questions and all were answered. The patient agreed with the plan and demonstrated an understanding of the instructions.   The patient was advised to call back or seek an in-person evaluation if the symptoms worsen or if the condition fails to improve as anticipated.  I provided 15 minutes of non-face-to-face time during this encounter.   [name], NP    Psychiatric Initial Adult Assessment   Patient Identification: Anita [name] MRN:  [id number] Date of Evaluation:  03/31/2022 Referral Source: [facility]- after a recent inpatient admission Chief Complaint:  No chief complaint on file.  Visit Diagnosis: No diagnosis found.  History of Present Illness:  Anita [name] is a 21 year old Caucasian female who presents after she reports her recent inpatient admission due to suicidal ideations.  Reports feeling stressed and overwhelmed due to "abandonment issues".  Denied that she was followed by therapy or psychiatry prior to admission.  States daily marijuana use.  Reports she is currently unemployed due to chronic pain in her mental health issues.  States she is seeking disability.  Reports she was prescribed Prozac and trazodone was recently discontinued due to worsening nightmares.  Reported previous inpatient admission May 6 through the 12 because she was triggered.  Juliona validates information provided and below assessment.  Patient to  start [facility] partial hospitalization programming on 03/29/2022   Per admission assessment note to the local emergency department: "Pt is a 21 yo female presenting from home with pmh of anxiety and depression presenting for complaints of depression. Pt states she has had increased depression, increaed sleep, self mutilation on right arm this week, and feelings of worthlessness. Pt denies suicidal ideation or threats. Admits to marijuana use daily, 1.5 g daily. Denies alcohol use or any other substance use. Denies increased energy or decreased need for sleep. Denies visual or auditory hallucinations. States she is compliant on Prozac, Buspar, Abilify, and Trazodone. Recently discharged from inpatient stay on 5/11. New meds started at that time was the Abilify and Trazodone."  Associated Signs/Symptoms: Depression Symptoms:  depressed mood, difficulty concentrating, suicidal attempt, anxiety, (Hypo) Manic Symptoms:  Distractibility, Anxiety Symptoms:  Excessive Worry, Psychotic Symptoms:  Hallucinations: None PTSD Symptoms: NA  Past Psychiatric History:   Previous Psychotropic Medications: Yes   Substance Abuse History in the last 12 months:  Yes.    Consequences of Substance Abuse: NA  Past Medical History:  Past Medical History:  Diagnosis Date  . Anxiety   . Asthma    age 67  . Bipolar disorder (HCC)   . Eczema   . Migraines     Past Surgical History:  Procedure Laterality Date  . CHOLECYSTECTOMY N/A 01/03/2022   Procedure: LAPAROSCOPIC CHOLECYSTECTOMY WITH INTRAOPERATIVE CHOLANGIOGRAM;  Surgeon: Manus Rudd, MD;  Location: [facility] OR;  Service: General;  Laterality: N/A;  . KNEE SURGERY  01/23/2015   L knee torn meniscus and ACL  . TONSILLECTOMY AND ADENOIDECTOMY  2012    Family Psychiatric History:   Family  History:  Family History  Problem Relation Age of Onset  . Alcohol abuse Mother   . Depression Mother   . Thyroid disease Mother   . Heart attack Mother  70       died of heart attack  . Bipolar disorder Mother   . Asthma Mother   . Vision loss Sister   . Asthma Father        as a child  . Hypertension Maternal Grandfather     Social History:   Social History   Socioeconomic History  . Marital status: Significant Other    Spouse name: Not on file  . Number of children: 0  . Years of education: Not on file  . Highest education level: Some college, no degree  Occupational History  . Occupation: Consulting civil engineer  Tobacco Use  . Smoking status: Former    Types: Cigarettes    Quit date: 03/11/2022    Years since quitting: 0.0  . Smokeless tobacco: Never  Vaping Use  . Vaping Use: Former  . Quit date: 03/11/2022  Substance and Sexual Activity  . Alcohol use: Not Currently  . Drug use: Yes    Frequency: 7.0 times per week    Types: Marijuana    Comment: .5 gm per day  . Sexual activity: Not Currently    Comment: depo before  Other Topics Concern  . Not on file  Social History Narrative   Lives with aunt (who has custody), cat and dog   Social Determinants of Health   Financial Resource Strain: Not on file  Food Insecurity: Not on file  Transportation Needs: Not on file  Physical Activity: Not on file  Stress: Not on file  Social Connections: Not on file    Additional Social History:   Allergies:   Allergies  Allergen Reactions  . Kiwi Extract Itching and Swelling  . Mobic [Meloxicam] Nausea Only  . Antihistamines, Diphenhydramine-Type Rash    Metabolic Disorder Labs: Lab Results  Component Value Date   HGBA1C 5.1 02/24/2022   MPG 99.67 02/24/2022   No results found for: "PROLACTIN" Lab Results  Component Value Date   CHOL 206 (H) 02/24/2022   TRIG 88 02/24/2022   HDL 59 02/24/2022   CHOLHDL 3.5 02/24/2022   VLDL 18 02/24/2022   LDLCALC 129 (H) 02/24/2022   Lab Results  Component Value Date   TSH 0.589 02/24/2022    Therapeutic Level Labs: No results found for: "LITHIUM" No results found for:  "CBMZ" No results found for: "VALPROATE"  Current Medications: Current Outpatient Medications  Medication Sig Dispense Refill  . albuterol (VENTOLIN HFA) 108 (90 Base) MCG/ACT inhaler Inhale two puffs into the lungs every 4 (four) hours as needed for Wheezing or Shortness of Breath. (Patient taking differently: Inhale 2 puffs into the lungs every 4 (four) hours as needed for wheezing or shortness of breath.) 18 g 2  . amphetamine-dextroamphetamine (ADDERALL) 20 MG tablet Take 20 mg by mouth 2 (two) times daily.    Marland Kitchen amphetamine-dextroamphetamine (ADDERALL) 20 MG tablet Take one tablet (20 mg dose) by mouth 2 (two) times daily. Max Daily Amount: 40 mg 60 tablet 0  . ARIPiprazole (ABILIFY) 15 MG tablet Take 1 tablet (15 mg total) by mouth daily. 30 tablet 0  . ARIPiprazole (ABILIFY) 15 MG tablet Take one tablet (15 mg dose) by mouth daily. 90 tablet 1  . busPIRone (BUSPAR) 10 MG tablet Take 2 tablets (20 mg total) by mouth 2 (two) times daily. 120 tablet  0  . Cholecalciferol (VITAMIN D3) 1.25 MG (50000 UT) CAPS Take one capsule by mouth once a week. 12 capsule 0  . FLUoxetine (PROZAC) 20 MG capsule Take 40 mg by mouth daily.    . fluticasone (FLONASE) 50 MCG/ACT nasal spray Place 2 sprays into both nostrils daily.    . hydrOXYzine (ATARAX) 25 MG tablet Take 1 tablet (25 mg total) by mouth every 6 (six) hours as needed for anxiety. (Patient not taking: Reported on 03/28/2022) 30 tablet 0  . loratadine (CLARITIN) 10 MG tablet Take 1 tablet (10 mg total) by mouth daily.    . montelukast (SINGULAIR) 10 MG tablet Take 1 tablet (10 mg dose) by mouth daily. 30 tablet 2  . nortriptyline (PAMELOR) 10 MG capsule Take 1 capsule (10 mg dose) by mouth at bedtime. (Patient not taking: Reported on 03/28/2022) 30 capsule 2  . nortriptyline (PAMELOR) 25 MG capsule Take one capsule (25 mg dose) by mouth at bedtime. 90 capsule 1  . traZODone (DESYREL) 100 MG tablet Take 1 tablet (100 mg total) by mouth at bedtime.  (Patient not taking: Reported on 03/28/2022) 30 tablet 0  . vitamin B-12 1000 MCG tablet Take 1 tablet (1,000 mcg total) by mouth daily. 30 tablet 0  . Vitamin D, Ergocalciferol, (DRISDOL) 1.25 MG (50000 UNIT) CAPS capsule Take 1 capsule (50,000 Units total) by mouth once a week. 5 capsule 0   No current facility-administered medications for this visit.    Musculoskeletal: Strength & Muscle Tone: within normal limits Gait & Station: normal Patient leans: N/A  Psychiatric Specialty Exam: Review of Systems  Last menstrual period 03/18/2022.There is no height or weight on file to calculate BMI.  General Appearance: Casual  Eye Contact:  Good  Speech:  Clear and Coherent  Volume:  Normal  Mood:  Anxious and Depressed  Affect:  Congruent  Thought Process:  Coherent  Orientation:  Full (Time, Place, and Person)  Thought Content:  Logical  Suicidal Thoughts:  No  Homicidal Thoughts:  No  Memory:  Immediate;   Good Recent;   Good  Judgement:  Good  Insight:  Good  Psychomotor Activity:  Normal  Concentration:  Concentration: Good  Recall:  Good  Fund of Knowledge:Good  Language: Good  Akathisia:  No  Handed:  Right  AIMS (if indicated):  done  Assets:  Communication Skills Desire for Improvement Social Support  ADL's:  Intact  Cognition: WNL  Sleep:  Good   Screenings: AIMS    Flowsheet Row [facility]  AIMS Total Score 0      AUDIT    Flowsheet Row Ad[facility]  Alcohol Use Disorder Identification Test Final Score (AUDIT) 4      PHQ2-9    Flowsheet Row Counselor from 03/28/2022 in [facility] Counselor from 03/21/2022 in [facility] Office Visit from 08/16/2020 in [facility] Office Visit from 05/11/2019 in [facility]  Office Visit from 07/02/2018 in [facility]  PHQ-2 Total Score 0 0  PHQ-9 Total Score -- --      Flowsheet Row Counselor from 03/28/2022 in [facility] Counselor from 03/21/2022 in [facility] ED from 03/19/2022 in [facility]  C-SSRS RISK CATEGORY Error: Q3, 4, or 5  should not be populated when Q2 is No Error: Q3, 4, or 5 should not be populated when Q2 is No High Risk       Assessment and Plan:  [facility] Continue Abilify, Prozac and hydroxyzine for mood stabilization   Collaboration of Care: Other continue medications as indicated  Patient/Guardian was advised Release of Information must be obtained prior to any record release in order to collaborate their care with an outside provider. Patient/Guardian was advised if they have not already done so to contact the registration department to sign all necessary forms in order for us to release information regarding their care.   Consent: Patient/Guardian gives verbal consent for treatment and assignment of benefits for services provided during this visit. Patient/Guardian expressed understanding and agreed to proceed.   Oneta Rackanika N Mateya Torti, NP 6/11/20233:28 PM

## 2022-04-01 ENCOUNTER — Other Ambulatory Visit (HOSPITAL_BASED_OUTPATIENT_CLINIC_OR_DEPARTMENT_OTHER): Payer: Self-pay

## 2022-04-01 ENCOUNTER — Ambulatory Visit (INDEPENDENT_AMBULATORY_CARE_PROVIDER_SITE_OTHER): Payer: Medicaid Other | Admitting: Licensed Clinical Social Worker

## 2022-04-01 DIAGNOSIS — F332 Major depressive disorder, recurrent severe without psychotic features: Secondary | ICD-10-CM | POA: Diagnosis not present

## 2022-04-01 MED ORDER — METRONIDAZOLE 500 MG PO TABS
ORAL_TABLET | ORAL | 0 refills | Status: AC
  Filled 2022-04-01: qty 14, 7d supply, fill #0

## 2022-04-01 NOTE — Progress Notes (Signed)
Spoke with patient via Webex video call, used 2 identifiers to correctly identify patient. States that groups are going well. 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 On scale 1-10 as 10 being worst she rates depression at 5 and anxiety at 8. Denies SI/HI or AV hallucinations. No issues or complaints. No side effects from medications.

## 2022-04-02 ENCOUNTER — Ambulatory Visit (INDEPENDENT_AMBULATORY_CARE_PROVIDER_SITE_OTHER): Payer: Medicaid Other | Admitting: Licensed Clinical Social Worker

## 2022-04-02 ENCOUNTER — Encounter (HOSPITAL_COMMUNITY): Payer: Self-pay | Admitting: Licensed Clinical Social Worker

## 2022-04-02 DIAGNOSIS — F332 Major depressive disorder, recurrent severe without psychotic features: Secondary | ICD-10-CM | POA: Diagnosis not present

## 2022-04-03 ENCOUNTER — Ambulatory Visit (INDEPENDENT_AMBULATORY_CARE_PROVIDER_SITE_OTHER): Payer: Medicaid Other | Admitting: Licensed Clinical Social Worker

## 2022-04-03 ENCOUNTER — Encounter (HOSPITAL_COMMUNITY): Payer: Self-pay | Admitting: Licensed Clinical Social Worker

## 2022-04-03 DIAGNOSIS — F332 Major depressive disorder, recurrent severe without psychotic features: Secondary | ICD-10-CM

## 2022-04-03 NOTE — Progress Notes (Signed)
Virtual Visit via Video Note  I connected with Anita [name] on 04/03/22 at  9:00 AM EDT by a video enabled telemedicine application and verified that I am speaking with the correct person using two identifiers.  Location: Patient: Home Provider: Office   I discussed the limitations of evaluation and management by telemedicine and the availability of in person appointments. The patient expressed understanding and agreed to proceed.   I discussed the assessment and treatment plan with the patient. The patient was provided an opportunity to ask questions and all were answered. The patient agreed with the plan and demonstrated an understanding of the instructions.   The patient was advised to call back or seek an in-person evaluation if the symptoms worsen or if the condition fails to improve as anticipated.  I provided 15 minutes of non-face-to-face time during this encounter.   [name], NP   BH MD/PA/NP OP Progress Note  04/03/2022 3:42 PM Anita [name]  MRN:  [id number]  Chief Complaint:  [name] reported " I am enjoying groups setting especially since there is no guys in group."  HPI: [name] was seen and evaluated via WebEx.  She is awake alert oriented x3.  Denying suicidal or homicidal ideations.  Denies auditory visual hallucinations.  She reports she is taking her medication as indicated.  Currently she is prescribed Prozac, Abilify, BuSpar and hydroxyzine.     Loveta reports some nights where her sleep is broken, but overall she is feels like she is in a better place.  Discussed utilizing hydroxyzine for reported sleeping issues.  Rates her depression 5 out of 10 with 10 being the worst.  Reports a good appetite.  Denied any medication concerns.  Support, encouragement  and reassurance was provided.    Visit Diagnosis:    ICD-10-CM   1. Severe episode of recurrent major depressive disorder, without psychotic features (HCC)  F33.2       Past Psychiatric  History:   Past Medical History:  Past Medical History:  Diagnosis Date   Anxiety    Asthma    age 29   Bipolar disorder (HCC)    Eczema    Migraines     Past Surgical History:  Procedure Laterality Date   CHOLECYSTECTOMY N/A 01/03/2022   Procedure: LAPAROSCOPIC CHOLECYSTECTOMY WITH INTRAOPERATIVE CHOLANGIOGRAM;  Surgeon: Manus Rudd, MD;  Location: [facility] OR;  Service: General;  Laterality: N/A;   KNEE SURGERY  01/23/2015   L knee torn meniscus and ACL   TONSILLECTOMY AND ADENOIDECTOMY  2012    Family Psychiatric History:   Family History:  Family History  Problem Relation Age of Onset   Alcohol abuse Mother    Depression Mother    Thyroid disease Mother    Heart attack Mother 89       died of heart attack   Bipolar disorder Mother    Asthma Mother    Vision loss Sister    Asthma Father        as a child   Hypertension Maternal Grandfather     Social History:  Social History   Socioeconomic History   Marital status: Significant Other    Spouse name: Not on file   Number of children: 0   Years of education: Not on file   Highest education level: Some college, no degree  Occupational History   Occupation: student  Tobacco Use   Smoking status: Former    Types: Cigarettes    Quit date: 03/11/2022  Years since quitting: 0.0   Smokeless tobacco: Never  Vaping Use   Vaping Use: Former   Quit date: 03/11/2022  Substance and Sexual Activity   Alcohol use: Not Currently   Drug use: Yes    Frequency: 7.0 times per week    Types: Marijuana    Comment: .5 gm per day   Sexual activity: Not Currently    Comment: depo before  Other Topics Concern   Not on file  Social History Narrative   Lives with aunt (who has custody), cat and dog   Social Determinants of Health   Financial Resource Strain: Not on file  Food Insecurity: Not on file  Transportation Needs: Not on file  Physical Activity: Not on file  Stress: Not on file  Social Connections: Not on file     Allergies:  Allergies  Allergen Reactions   Kiwi Extract Itching and Swelling   Mobic [Meloxicam] Nausea Only   Antihistamines, Diphenhydramine-Type Rash    Metabolic Disorder Labs: Lab Results  Component Value Date   HGBA1C 5.1 02/24/2022   MPG 99.67 02/24/2022   No results found for: "PROLACTIN" Lab Results  Component Value Date   CHOL 206 (H) 02/24/2022   TRIG 88 02/24/2022   HDL 59 02/24/2022   CHOLHDL 3.5 02/24/2022   VLDL 18 02/24/2022   LDLCALC 129 (H) 02/24/2022   Lab Results  Component Value Date   TSH 0.589 02/24/2022    Therapeutic Level Labs: No results found for: "LITHIUM" No results found for: "VALPROATE" No results found for: "CBMZ"  Current Medications: Current Outpatient Medications  Medication Sig Dispense Refill   albuterol (VENTOLIN HFA) 108 (90 Base) MCG/ACT inhaler Inhale two puffs into the lungs every 4 (four) hours as needed for Wheezing or Shortness of Breath. (Patient taking differently: Inhale 2 puffs into the lungs every 4 (four) hours as needed for wheezing or shortness of breath.) 18 g 2   amphetamine-dextroamphetamine (ADDERALL) 20 MG tablet Take 20 mg by mouth 2 (two) times daily.     amphetamine-dextroamphetamine (ADDERALL) 20 MG tablet Take one tablet (20 mg dose) by mouth 2 (two) times daily. Max Daily Amount: 40 mg 60 tablet 0   ARIPiprazole (ABILIFY) 15 MG tablet Take 1 tablet (15 mg total) by mouth daily. 30 tablet 0   ARIPiprazole (ABILIFY) 15 MG tablet Take one tablet (15 mg dose) by mouth daily. 90 tablet 1   busPIRone (BUSPAR) 10 MG tablet Take 2 tablets (20 mg total) by mouth 2 (two) times daily. 120 tablet 0   Cholecalciferol (VITAMIN D3) 1.25 MG (50000 UT) CAPS Take one capsule by mouth once a week. 12 capsule 0   FLUoxetine (PROZAC) 20 MG capsule Take 40 mg by mouth daily.     fluticasone (FLONASE) 50 MCG/ACT nasal spray Place 2 sprays into both nostrils daily.     hydrOXYzine (ATARAX) 25 MG tablet Take 1 tablet (25 mg  total) by mouth every 6 (six) hours as needed for anxiety. (Patient not taking: Reported on 03/28/2022) 30 tablet 0   loratadine (CLARITIN) 10 MG tablet Take 1 tablet (10 mg total) by mouth daily.     metroNIDAZOLE (FLAGYL) 500 MG tablet Take one tablet (500 mg dose) by mouth 2 (two) times daily for 7 days. 14 tablet 0   montelukast (SINGULAIR) 10 MG tablet Take 1 tablet (10 mg dose) by mouth daily. 30 tablet 2   nortriptyline (PAMELOR) 10 MG capsule Take 1 capsule (10 mg dose) by mouth at bedtime. (  Patient not taking: Reported on 03/28/2022) 30 capsule 2   nortriptyline (PAMELOR) 25 MG capsule Take one capsule (25 mg dose) by mouth at bedtime. 90 capsule 1   traZODone (DESYREL) 100 MG tablet Take 1 tablet (100 mg total) by mouth at bedtime. (Patient not taking: Reported on 03/28/2022) 30 tablet 0   vitamin B-12 1000 MCG tablet Take 1 tablet (1,000 mcg total) by mouth daily. 30 tablet 0   Vitamin D, Ergocalciferol, (DRISDOL) 1.25 MG (50000 UNIT) CAPS capsule Take 1 capsule (50,000 Units total) by mouth once a week. 5 capsule 0   No current facility-administered medications for this visit.     Musculoskeletal: Strength & Muscle Tone: within normal limits Gait & Station: normal Patient leans: N/A  Psychiatric Specialty Exam: Review of Systems  Respiratory: Negative.    Cardiovascular: Negative.   Allergic/Immunologic: Negative.   Psychiatric/Behavioral:  Negative for hallucinations and suicidal ideas. The patient is nervous/anxious.   All other systems reviewed and are negative.   Last menstrual period 03/18/2022.There is no height or weight on file to calculate BMI.  General Appearance: Casual  Eye Contact:  Good  Speech:  Clear and Coherent  Volume:  Normal  Mood:  Anxious and Depressed  Affect:  Congruent  Thought Process:  Coherent  Orientation:  Full (Time, Place, and Person)  Thought Content: WDL   Suicidal Thoughts:  No  Homicidal Thoughts:  No  Memory:  Immediate;    Good Recent;   Good Remote;   Good  Judgement:  Good  Insight:  Good  Psychomotor Activity:  Normal  Concentration:  Concentration: Good  Recall:  Fair  Fund of Knowledge: Good  Language: Good  Akathisia:  No  Handed:  Right  AIMS (if indicated): done  Assets:  Communication Skills Desire for Improvement Social Support  ADL's:  Intact  Cognition: WNL  Sleep:  Fair   Screenings: AIMS    Flowsheet Row [facility]  AIMS Total Score 0      AUDIT    Flowsheet Row Ad[facility] (Discharged[facility]  Alcohol Use Disorder Identification Test Final Score (AUDIT) 4      PHQ2-9    Flowsheet Row [facility]  PHQ-2 Total Score 6 2 2  0 0  PHQ-9 Total Score 25 13 14  -- --      Flowsheet Row [facility]  C-SSRS RISK CATEGORY Error: Q3, 4, or 5 should not be populated when Q2 is No Error: Q3, 4, or 5 should not be populated when Q2 is No High Risk        Assessment and Plan:  [facility] Continue Abilify 15 mg daily Continue BuSpar 10 mg p.o. twice daily Continue Prozac 40 mg daily She discontinued trazodone due to worsening nightmares   Collaboration of Care: Collaboration of Care: [facility]  Patient/Guardian was advised Release of Information must be  obtained prior to any record release in order to  collaborate their care with an outside provider. Patient/Guardian was advised if they have not already done so to contact the registration department to sign all necessary forms in order for [place] to release information regarding their care.   Consent: Patient/Guardian gives verbal consent for treatment and assignment of benefits for services provided during this visit. Patient/Guardian expressed understanding and agreed to proceed.    [name], NP 04/03/2022, 3:42 PM

## 2022-04-04 ENCOUNTER — Ambulatory Visit (INDEPENDENT_AMBULATORY_CARE_PROVIDER_SITE_OTHER): Payer: Medicaid Other | Admitting: Licensed Clinical Social Worker

## 2022-04-04 DIAGNOSIS — F332 Major depressive disorder, recurrent severe without psychotic features: Secondary | ICD-10-CM | POA: Diagnosis not present

## 2022-04-05 ENCOUNTER — Ambulatory Visit (INDEPENDENT_AMBULATORY_CARE_PROVIDER_SITE_OTHER): Payer: Medicaid Other | Admitting: Licensed Clinical Social Worker

## 2022-04-05 DIAGNOSIS — F332 Major depressive disorder, recurrent severe without psychotic features: Secondary | ICD-10-CM

## 2022-04-08 ENCOUNTER — Ambulatory Visit (HOSPITAL_COMMUNITY): Payer: Self-pay

## 2022-04-09 ENCOUNTER — Ambulatory Visit (HOSPITAL_COMMUNITY): Payer: Self-pay

## 2022-04-09 ENCOUNTER — Telehealth (HOSPITAL_COMMUNITY): Payer: Self-pay | Admitting: Licensed Clinical Social Worker

## 2022-04-10 ENCOUNTER — Ambulatory Visit (INDEPENDENT_AMBULATORY_CARE_PROVIDER_SITE_OTHER): Payer: Medicaid Other | Admitting: Professional

## 2022-04-10 ENCOUNTER — Encounter (HOSPITAL_COMMUNITY): Payer: Self-pay | Admitting: Licensed Clinical Social Worker

## 2022-04-10 DIAGNOSIS — F332 Major depressive disorder, recurrent severe without psychotic features: Secondary | ICD-10-CM

## 2022-04-10 NOTE — Progress Notes (Deleted)
BH MD/PA/NP OP Progress Note  04/10/2022 3:20 PM Anita [name]  MRN:  [id number]  Chief Complaint: No chief complaint on file.  HPI:  Anita [name] was seen and evaluated via WebEx.  She reports " not feeling the greatest today."  [name] presents with concerns related to current group settings.  States that he does not by been in group today.  Reports frustration with facilitator.  Reports increased anxiety and mild depression.  She is denying suicidal or homicidal ideations.  Denies auditory visual hallucinations.    Visit Diagnosis:    ICD-10-CM   1. Severe episode of recurrent major depressive disorder, without psychotic features (HCC)  F33.2       Past Psychiatric History: ***  Past Medical History:  Past Medical History:  Diagnosis Date   Anxiety    Asthma    age 47   Bipolar disorder (HCC)    Eczema    Migraines     Past Surgical History:  Procedure Laterality Date   CHOLECYSTECTOMY N/A 01/03/2022   Procedure: LAPAROSCOPIC CHOLECYSTECTOMY WITH INTRAOPERATIVE CHOLANGIOGRAM;  Surgeon: Manus Rudd, MD;  Location: [facility] OR;  Service: General;  Laterality: N/A;   KNEE SURGERY  01/23/2015   L knee torn meniscus and ACL   TONSILLECTOMY AND ADENOIDECTOMY  2012    Family Psychiatric History: ***  Family History:  Family History  Problem Relation Age of Onset   Alcohol abuse Mother    Depression Mother    Thyroid disease Mother    Heart attack Mother 47       died of heart attack   Bipolar disorder Mother    Asthma Mother    Vision loss Sister    Asthma Father        as a child   Hypertension Maternal Grandfather     Social History:  Social History   Socioeconomic History   Marital status: Significant Other    Spouse name: Not on file   Number of children: 0   Years of education: Not on file   Highest education level: Some college, no degree  Occupational History   Occupation: student  Tobacco Use   Smoking status: Former    Types: Cigarettes     Quit date: 03/11/2022    Years since quitting: 0.0   Smokeless tobacco: Never  Vaping Use   Vaping Use: Former   Quit date: 03/11/2022  Substance and Sexual Activity   Alcohol use: Not Currently   Drug use: Yes    Frequency: 7.0 times per week    Types: Marijuana    Comment: .5 gm per day   Sexual activity: Not Currently    Comment: depo before  Other Topics Concern   Not on file  Social History Narrative   Lives with aunt (who has custody), cat and dog   Social Determinants of Health   Financial Resource Strain: Not on file  Food Insecurity: Not on file  Transportation Needs: Not on file  Physical Activity: Not on file  Stress: Not on file  Social Connections: Not on file    Allergies:  Allergies  Allergen Reactions   Kiwi Extract Itching and Swelling   Mobic [Meloxicam] Nausea Only   Antihistamines, Diphenhydramine-Type Rash    Metabolic Disorder Labs: Lab Results  Component Value Date   HGBA1C 5.1 02/24/2022   MPG 99.67 02/24/2022   No results found for: "PROLACTIN" Lab Results  Component Value Date   CHOL 206 (H) 02/24/2022   TRIG 88 02/24/2022  HDL 59 02/24/2022   CHOLHDL 3.5 02/24/2022   VLDL 18 02/24/2022   LDLCALC 129 (H) 02/24/2022   Lab Results  Component Value Date   TSH 0.589 02/24/2022    Therapeutic Level Labs: No results found for: "LITHIUM" No results found for: "VALPROATE" No results found for: "CBMZ"  Current Medications: Current Outpatient Medications  Medication Sig Dispense Refill   albuterol (VENTOLIN HFA) 108 (90 Base) MCG/ACT inhaler Inhale two puffs into the lungs every 4 (four) hours as needed for Wheezing or Shortness of Breath. (Patient taking differently: Inhale 2 puffs into the lungs every 4 (four) hours as needed for wheezing or shortness of breath.) 18 g 2   amphetamine-dextroamphetamine (ADDERALL) 20 MG tablet Take 20 mg by mouth 2 (two) times daily.     amphetamine-dextroamphetamine (ADDERALL) 20 MG tablet Take one  tablet (20 mg dose) by mouth 2 (two) times daily. Max Daily Amount: 40 mg 60 tablet 0   ARIPiprazole (ABILIFY) 15 MG tablet Take 1 tablet (15 mg total) by mouth daily. 30 tablet 0   ARIPiprazole (ABILIFY) 15 MG tablet Take one tablet (15 mg dose) by mouth daily. 90 tablet 1   busPIRone (BUSPAR) 10 MG tablet Take 2 tablets (20 mg total) by mouth 2 (two) times daily. 120 tablet 0   Cholecalciferol (VITAMIN D3) 1.25 MG (50000 UT) CAPS Take one capsule by mouth once a week. 12 capsule 0   FLUoxetine (PROZAC) 20 MG capsule Take 40 mg by mouth daily.     fluticasone (FLONASE) 50 MCG/ACT nasal spray Place 2 sprays into both nostrils daily.     hydrOXYzine (ATARAX) 25 MG tablet Take 1 tablet (25 mg total) by mouth every 6 (six) hours as needed for anxiety. 30 tablet 0   loratadine (CLARITIN) 10 MG tablet Take 1 tablet (10 mg total) by mouth daily.     metroNIDAZOLE (FLAGYL) 500 MG tablet Take one tablet (500 mg dose) by mouth 2 (two) times daily for 7 days. 14 tablet 0   montelukast (SINGULAIR) 10 MG tablet Take 1 tablet (10 mg dose) by mouth daily. 30 tablet 2   nortriptyline (PAMELOR) 25 MG capsule Take one capsule (25 mg dose) by mouth at bedtime. 90 capsule 1   vitamin B-12 1000 MCG tablet Take 1 tablet (1,000 mcg total) by mouth daily. 30 tablet 0   Vitamin D, Ergocalciferol, (DRISDOL) 1.25 MG (50000 UNIT) CAPS capsule Take 1 capsule (50,000 Units total) by mouth once a week. 5 capsule 0   nortriptyline (PAMELOR) 10 MG capsule Take 1 capsule (10 mg dose) by mouth at bedtime. (Patient not taking: Reported on 03/28/2022) 30 capsule 2   traZODone (DESYREL) 100 MG tablet Take 1 tablet (100 mg total) by mouth at bedtime. (Patient not taking: Reported on 03/28/2022) 30 tablet 0   No current facility-administered medications for this visit.     Musculoskeletal: Strength & Muscle Tone: {desc; muscle tone:32375} Gait & Station: {PE GAIT ED QIHK:74259} Patient leans: {Patient Leans:21022755}  Psychiatric  Specialty Exam: Review of Systems  Last menstrual period 03/18/2022.There is no height or weight on file to calculate BMI.  General Appearance: {Appearance:22683}  Eye Contact:  {BHH EYE CONTACT:22684}  Speech:  {Speech:22685}  Volume:  {Volume (PAA):22686}  Mood:  {BHH MOOD:22306}  Affect:  {Affect (PAA):22687}  Thought Process:  {Thought Process (PAA):22688}  Orientation:  {BHH ORIENTATION (PAA):22689}  Thought Content: {Thought Content:22690}   Suicidal Thoughts:  {ST/HT (PAA):22692}  Homicidal Thoughts:  {ST/HT (PAA):22692}  Memory:  {BHH  QQVZDG:38756}  Judgement:  {Judgement (PAA):22694}  Insight:  {Insight (PAA):22695}  Psychomotor Activity:  {Psychomotor (PAA):22696}  Concentration:  {Concentration:21399}  Recall:  {BHH GOOD/FAIR/POOR:22877}  Fund of Knowledge: {BHH GOOD/FAIR/POOR:22877}  Language: {BHH GOOD/FAIR/POOR:22877}  Akathisia:  {BHH YES OR NO:22294}  Handed:  {Handed:22697}  AIMS (if indicated): {Desc; done/not:10129}  Assets:  {Assets (PAA):22698}  ADL's:  {BHH EPP'I:95188}  Cognition: {chl bhh cognition:304700322}  Sleep:  {BHH GOOD/FAIR/POOR:22877}   Screenings: AIMS    Flowsheet Row [facility]  AIMS Total Score 0      AUDIT    Flowsheet Row Ad[facility] ([facility]  Alcohol Use Disorder Identification Test Final Score (AUDIT) 4      PHQ2-9    Flowsheet Row Counselor from 03/28/2022 in [facility] Counselor from 03/21/2022 in [facility] Office Visit from 08/16/2020 in [facility] Office Visit from 05/11/2019 in [facility] Office Visit from 07/02/2018 in [facility]  PHQ-2 Total Score 6 2 2  0 0  PHQ-9 Total Score 25 13 14  -- --      Flowsheet Row Counselor from 03/28/2022 in [facility] Counselor from 03/21/2022 in [facility] ED from 03/19/2022 in [facility]  C-SSRS RISK CATEGORY Error: Q3, 4, or 5 should not be populated when Q2 is No Error: Q3, 4, or 5 should not be populated when Q2 is No High Risk        Assessment and Plan: ***  Collaboration of Care: Collaboration of Care: [facility] OP Collaboration of Care:[id number]}  Patient/Guardian was advised Release of Information must be obtained prior to any record release in order to collaborate their care with an outside provider. Patient/Guardian was advised if they have not already done so to contact the registration department to sign all necessary forms in order for [facility] to release information regarding their care.   Consent: Patient/Guardian gives verbal consent for treatment and assignment of benefits for services provided during this visit. Patient/Guardian expressed understanding and agreed to proceed.    [facility], NP 04/10/2022, 3:20 PM

## 2022-04-10 NOTE — Progress Notes (Signed)
"Cln met with pt and counselor, Loree Fee, on Webex in a breakout room to discuss d/c on 6/23 as well as concerns related to pt's behavior in group. Cln agreed to be present due to pt's apparent efforts to split staff, as evidenced by private message sent to [place], OT detailed below. Pt initially stated "I don't want to do that" in reference to d/c on 6/23. Whitney relayed that PHP lasts 2-3 weeks and reminded pt of start date. Cln reviewed therapy and medication management appointments scheduled at [facility] and pt stated she did not have any questions. Whitney reminded pt of group rules and addressed feedback she received from [place], NP regarding Whitney's time leading group this week. Whitney shared that there is indication that communication is being had with other pts during group (via text, chat, etc) as evidenced by pt's using the same verbiage when discussing issues with Tanika. Whitney reminded pt that this goes against group policy, which was outlined in the virtual group commitments that all PHP pts are required to sign. Pt denied this was occurring. Loree Fee Whitney pointed out that pt stated during check-in that her mood went from a 10 to a 3 because [place] wasn't there and she doesn't like change, and that she was triggered by the change. Whitney reiterated her response from group during this conversation to state that Sonia Baller needs time off like all of [place] because she's human and that because group is a safe environment, now is a good time to practice navigating change. Pt also expressed being offended that Whitney reminded her that group is voluntary and that she is welcome to leave if the program is not beneficial to her. Loree Fee also acknowledged pt's private message to Percell Miller, which was ""we all dislike whitney.save [place] please bring someone else this she is not helping [place] but only bring more issues."  Whitney reminded pt that she was absent Monday and Tuesday, and cln reminded pt that last week pt verbalized  complaints regarding Percell Miller, the OT, as well as Tanika, and that it sounds like PHP isn't a good fit for her. Whitney apologized for coming across the way pt reports it was perceived and reiterated that the remark was intended to empower pts to decline PHP if it is not beneficial for them. Cln discussed how feelings are valid, but pt's responses to her feelings in group have been inappropriate and negatively impact the group dynamic, such as during check-in this morning and last week when cln witnessed pt complain about Edward's group while he was still signed onto [employer]. Pt said "I'm sorry" and rolled her eyes. Cln pointed this out as being continued disrespect and inappropriate behavior and pt stated "I don't appreciate being talked to like a child." Cln asked specifically about this perception and pt stated, "Talking down to me." Cln explained that it is not punitive and it is to remind her of group rules. Cln further explained, "It's not criticism, it's feedback." Pt asked if we would be having similar discussions with other group members and cln and Whitney informed her we will not be discussing other group members with her, but will only address her behavior and concerns. Cln explained she is not being blamed for group dynamics and that while her feelings are valid, her behaviors are not appropriate and continuing them will be grounds for discharge. Pt stated, "I'm getting discharged anyway." Whitney clarified that it would mean immediate d/c should behaviors continue. Pt verbalized understanding and denied having other concerns at that  time.

## 2022-04-10 NOTE — Progress Notes (Signed)
Spoke with patient via Webex video call, used 2 identifiers to correctly identify patient. States that she is in need of refills for Adderall, Prozac and Buspar. Has a lot of anxiety feeling anxious, scared to leave the house, and doesn't like being alone. She is having pain from fibromyalgia due to moving this past weekend. Message sent to NP for refill request. Also very upset about group this week. States that the counselor filling in for Boneta Lucks is rude and belittling. She states that she was told she didn't have dyslexia when she was having a hard time reading a form during group and said the counselor belittled her in front of everyone and was dismissive to her feelings. She then told the counselor that she did not like group and was told she could leave. She states that she was upset because she wants to be in group and get help but not be talked down to. Was also upset that yesterday she checked into group and then had to go to urgent care to be seen for a couple issues but was afraid she was counted as a no show even though she came to group and explained why she had to leave. On scale 1-10 as 10 being worst she rates depression at 3 and anxiety at 8. Denies SI/HI or AV hallucinations. No side effects from medications.

## 2022-04-11 ENCOUNTER — Telehealth (HOSPITAL_COMMUNITY): Payer: Self-pay | Admitting: Licensed Clinical Social Worker

## 2022-04-11 ENCOUNTER — Ambulatory Visit (INDEPENDENT_AMBULATORY_CARE_PROVIDER_SITE_OTHER): Payer: Medicaid Other | Admitting: Licensed Clinical Social Worker

## 2022-04-11 DIAGNOSIS — F332 Major depressive disorder, recurrent severe without psychotic features: Secondary | ICD-10-CM | POA: Diagnosis not present

## 2022-04-11 NOTE — Progress Notes (Deleted)
Virtual Visit via Video Note  I connected with Anita [name] on 04/11/22 at  9:00 AM EDT by a video enabled telemedicine application and verified that I am speaking with the correct person using two identifiers.  Location: Patient: pt's home Provider: clinical home office   I discussed the limitations of evaluation and management by telemedicine and the availability of in person appointments. The patient expressed understanding and agreed to proceed.   I discussed the assessment and treatment plan with the patient. The patient was provided an opportunity to ask questions and all were answered. The patient agreed with the plan and demonstrated an understanding of the instructions.   The patient was advised to call back or seek an in-person evaluation if the symptoms worsen or if the condition fails to improve as anticipated.  I provided 240 minutes of non-face-to-face time during this encounter.   Wyvonnia Lora, LCSWA    Session Time: 9:00 am - 10:00 am  Participation Level: Active  Behavioral Response: CasualAlertAnxious and Depressed  Type of Therapy: Group Therapy  Treatment Goals addressed: Coping  Progress Towards Goals: Progressing  Interventions: CBT, DBT, Solution Focused, Strength-based, Supportive, and Reframing  Therapist Response: Clinician led check-in regarding current stressors and situation, and review of patient completed daily inventory. Clinician utilized active listening and empathetic response and validated patient emotions. Clinician facilitated processing group on pertinent issues.?   Summary: Anita [name] is a 21yo female who presents with depression and anxiety symptoms. Patient arrived within time allowed. Patient rates her mood at a 3 on a scale of 1-10 with 10 being best. Pt reported, "Everything that happened yesterday and extra stuff happened yesterday. My kidneys hurt and I'm not trying to drive an hour to get to Urgent Care. Yesterday  sucked." Details taking space from a friend and that it's hard to cope with. Endorses SH thoughts yesterday and did not engage because she was too tired. She states she slept for 12 hours overnight and reports binge-eating yesterday. Pt denied experiencing SI since last session.  Pt stated she would like to discuss trust during processing. Pt able to process.?Pt engaged in discussion.?      Session Time: 10:00 am - 11:00 am  Participation Level: Active  Behavioral Response: CasualAlertAnxious and Depressed  Type of Therapy: Group Therapy  Treatment Goals addressed: Coping  Progress Towards Goals: Progressing  Interventions: CBT, DBT, Solution Focused, Strength-based, Supportive, and Reframing  Therapist Response: Clinician led group on trust and briefly discussed sleep hygiene. Clinician utilized CBT principles to inform discussion.   Summary: Pt engaged in discussion. "My trust issues are valid because everyone breaks my trust." Pt also recommended Insight Timer app to peer to aid in sleep and relaxation. Pt supportive of peers.    Session Time: 11:00 am - 12:00 pm  Participation Level: Active  Behavioral Response: CasualAlertAnxious and Depressed  Type of Therapy: Group Therapy  Treatment Goals addressed: Coping  Progress Towards Goals: Progressing  Interventions: CBT, DBT, Solution Focused, Strength-based, Supportive, and Reframing  Therapist Response: Clinician led group on Cognitive Distortions. Clinician listed different types of cognitive distortions and provided examples. Pts were asked to provide personal examples of cognitive distortions they currently experience or have previously experienced.  Summary: Pt indicated she identifies with overgeneralization as evidenced by her assertion that "Ya'll can't drive in the rain" in reference to [employer]. Pt provided examples such as having hazard lights turned on and having windshield wipers running on high during a  light drizzle.  Pt resistant to feedback from cln regarding how this is a cognitive distortion.   Session Time: 12:00 pm - 1:00 pm  Participation Level: None  Behavioral Response:  UTA  Type of Therapy: Group Therapy  Treatment Goals addressed: Coping  Progress Towards Goals: n/a  Interventions: CBT, DBT, Solution Focused, Strength-based, Supportive, and Reframing  Therapist Response: 12:00 - 12:50 pm: Clinician continued group on cognitive distortions and invited patients to share reactions and feelings related to the topic and clinician's method of discussing. Clinician also explained that CBT is based on challenging unhelpful thought patterns and reiterated to pts that their feelings are valid and identified engagement in therapy as a strength. Cln also pointed out that therapy is often uncomfortable and is often supposed to bring out strong emotions. 12:50 - 1:00 pm: Clinician led check-out. Clinician assessed for immediate needs, medication compliance and efficacy, and safety concerns?  Summary: 12:00 - 12:50 pm: Pt was signed onto group but her camera was off and she did not respond. Cln called pt after group to confirm safety and pt reported she fell asleep. She denied SI.  Suicidal/Homicidal: Nowithout intent/plan  Plan: ?Pt will continue in PHP and medication management while continuing to work on decreasing depression symptoms,?SI, and anxiety symptoms,?and increasing the ability to self manage symptoms.

## 2022-04-12 ENCOUNTER — Telehealth (HOSPITAL_COMMUNITY): Payer: Self-pay | Admitting: Licensed Clinical Social Worker

## 2022-04-12 ENCOUNTER — Ambulatory Visit (INDEPENDENT_AMBULATORY_CARE_PROVIDER_SITE_OTHER): Payer: Medicaid Other | Admitting: Licensed Clinical Social Worker

## 2022-04-12 ENCOUNTER — Other Ambulatory Visit (HOSPITAL_BASED_OUTPATIENT_CLINIC_OR_DEPARTMENT_OTHER): Payer: Self-pay

## 2022-04-12 DIAGNOSIS — F332 Major depressive disorder, recurrent severe without psychotic features: Secondary | ICD-10-CM | POA: Diagnosis not present

## 2022-04-15 ENCOUNTER — Other Ambulatory Visit (HOSPITAL_BASED_OUTPATIENT_CLINIC_OR_DEPARTMENT_OTHER): Payer: Self-pay

## 2022-04-18 NOTE — Psych (Signed)
Virtual Visit via Video Note  I connected with Anita [name] on 04/10/22 at  9:00 AM EDT by a video enabled telemedicine application and verified that I am speaking with the correct person using two identifiers.  Location: Patient: Home Provider: Clinical Home Office   I discussed the limitations of evaluation and management by telemedicine and the availability of in person appointments. The patient expressed understanding and agreed to proceed.  Follow Up Instructions:    I discussed the assessment and treatment plan with the patient. The patient was provided an opportunity to ask questions and all were answered. The patient agreed with the plan and demonstrated an understanding of the instructions.   The patient was advised to call back or seek an in-person evaluation if the symptoms worsen or if the condition fails to improve as anticipated.  I provided 240 minutes of non-face-to-face time during this encounter.   Quinn Axe, [facility]   [facility] PHP THERAPIST PROGRESS NOTE  Anita [name] [id number]  Session Time: 9:00-10:00a  Participation Level: Active  Behavioral Response: CasualAlertIrritable  Type of Therapy: Group Therapy  Treatment Goals addressed: Coping  Progress Towards Goals: Not Progressing  Interventions: CBT, DBT, Solution Focused, Strength-based, Supportive, and Reframing  Summary: Clinician led check-in regarding current stressors and situation, and review of patient completed daily inventory. Clinician utilized active listening and empathetic response and validated patient emotions. Clinician facilitated processing group on pertinent issues.  Therapist Response:  Anita [name] is a 21 y.o. female who presents with depression and anxiety symptoms. Patient arrived within time allowed. Patient rates her mood at a 3 on a scale of 1-10 with 10 being best. Pt reports "I was at a 10 but it plummeted coming to group because Boneta Lucks isn't here." Pt  reports she does not want to do group with any other counselor but Boneta Lucks. This cln discussed cln's are human and sometimes need time off. Pt reports "we have trauma with change and this isn't ok." Cln discusses that life is full of change and this is a safe environment to practice dealing with change. Pt reports she went to urgent care yesterday due to a kidney infection and was turned away "because they ran my insurance three times and insurance wouldn't pay for it."  Pt reports she will go to ED if pain gets worse. Pt reports she moved stuff out of her house. Pt reports trouble trusting people. Pt able to process. ?Pt engaged in discussion.?        Session Time: 10:00 am - 11:00 am   Participation Level: Active  Behavioral Response: CasualAlertAnxious and Depressed   Type of Therapy: Group Therapy   Treatment Goals addressed: Coping   Progress Towards Goals: Progressing   Interventions: CBT, DBT, Solution Focused, Strength-based, Supportive, and Reframing   Therapist Response: Clinician led group on anger.   Summary: Pt reports anger and trying to manage in more healthy ways.       Session Time: 11:00 am - 12:00 pm   Participation Level: Active   Behavioral Response: CasualAlertAnxious and Depressed   Type of Therapy: Group Therapy   Treatment Goals addressed: Coping   Progress Towards Goals: Progressing  Interventions: Skills training, Supportive   Summary: Anita [name], talked with group about community.   Therapist Response: Pt participated in group and discussed what community means to self.     Session Time: 12:00 pm - 1:00 pm   Participation Level: Active   Behavioral Response: CasualAlertAnxious and  Depressed   Type of Therapy: Group Therapy   Treatment Goals addressed: Coping   Progress Towards Goals: Progressing   Interventions: Skills training, Supportive   Therapist Response: 12:00 - 12:50 pm: OT group with Anita [name] about  relationships 12:50 - 1:00 pm: Clinician led check-out. Clinician assessed for immediate needs, medication compliance and efficacy, and safety concerns?   Summary: 12:00 - 12:50 pm: Pt participated in group 12:50 - 1:00: Patient demonstrates limited progress as evidence by resistance to trying new skills. Patient denies SI/HI/self-harm at the end of group.  Suicidal/Homicidal: Nowithout intent/plan  Plan: Pt will continue in PHP and medication management while continuing to work on decreasing depression symptoms, SI, and increasing the ability to self manage symptoms.   Collaboration of Care: Medication Management AEB Hillery Jacks, NP  Patient/Guardian was advised Release of Information must be obtained prior to any record release in order to collaborate their care with an outside provider. Patient/Guardian was advised if they have not already done so to contact the registration department to sign all necessary forms in order for [place] to release information regarding their care.   Consent: Patient/Guardian gives verbal consent for treatment and assignment of benefits for services provided during this visit. Patient/Guardian expressed understanding and agreed to proceed.   Diagnosis: Severe episode of recurrent major depressive disorder, without psychotic features (HCC) [F33.2]    1. Severe episode of recurrent major depressive disorder, without psychotic features [facility])       Quinn Axe, [facility] 04/10/22

## 2022-04-20 NOTE — Progress Notes (Cosign Needed Addendum)
Virtual Visit via Video Note  I connected with Anita [name] on 04/10/2022 at  9:00 AM EDT by a video enabled telemedicine application and verified that I am speaking with the correct person using two identifiers.  Location: Patient: Home Provider: office   I discussed the limitations of evaluation and management by telemedicine and the availability of in person appointments. The patient expressed understanding and agreed to proceed.   I discussed the assessment and treatment plan with the patient. The patient was provided an opportunity to ask questions and all were answered. The patient agreed with the plan and demonstrated an understanding of the instructions.   The patient was advised to call back or seek an in-person evaluation if the symptoms worsen or if the condition fails to improve as anticipated.  I provided 15 minutes of non-face-to-face time during this encounter.   Anita Rack, NP   [facility] Outpatient Program Discharge Summary  Anita [name] [id number]  Admission date: 03/29/2022 Discharge date: 04/10/2022  Reason for admission: per admission assessment note: Anita [name] is a 21 year old Caucasian female who presents after she reports her recent inpatient admission due to suicidal ideations.  Reports feeling stressed and overwhelmed due to "abandonment issues".  Denied that she was followed by therapy or psychiatry prior to admission.  States daily marijuana use.  Reports she is currently unemployed due to chronic pain in her mental health issues.  States she is seeking disability.  Reports she was prescribed Prozac and trazodone was recently discontinued due to worsening nightmares.   Progress in Program Toward Treatment Goals: Progressing- Stated she wasn't having a good day today and was frustrated with the facilitator. She is denying suicidal or homicidal ideations. She reported mild depression and anxiety.  Patient to keep all outpatient follow-up appointments. Declined medication refills.   Progress (rationale):  Keep follow-up with PCP  Take all medications as prescribed. Keep all follow-up appointments as scheduled.  Do not consume alcohol or use illegal drugs while on prescription medications. Report any adverse effects from your medications to your primary care provider promptly.  In the event of recurrent symptoms or worsening symptoms, call 911, a crisis hotline, or go to the nearest emergency department for evaluation.    Collaboration of Care: Medication Management AEB Prozac, Buspar  and Trazodone   Patient/Guardian was advised Release of Information must be obtained prior to any record release in order to collaborate their care with an outside provider. Patient/Guardian was advised if they have not already done so to contact the registration department to sign all necessary forms in order for [place] to release information regarding their care.   Consent: Patient/Guardian gives verbal consent for treatment and assignment of benefits for services provided during this visit. Patient/Guardian expressed understanding and agreed to proceed.   Anita Jacks NP  04/20/2022

## 2022-04-30 ENCOUNTER — Other Ambulatory Visit (HOSPITAL_BASED_OUTPATIENT_CLINIC_OR_DEPARTMENT_OTHER): Payer: Self-pay

## 2022-05-13 ENCOUNTER — Encounter (HOSPITAL_COMMUNITY): Payer: Self-pay

## 2022-05-13 ENCOUNTER — Ambulatory Visit (HOSPITAL_COMMUNITY): Payer: Medicaid Other | Admitting: Licensed Clinical Social Worker

## 2022-05-17 ENCOUNTER — Other Ambulatory Visit (HOSPITAL_COMMUNITY): Payer: Self-pay

## 2022-05-20 ENCOUNTER — Ambulatory Visit (INDEPENDENT_AMBULATORY_CARE_PROVIDER_SITE_OTHER): Payer: Medicaid Other | Admitting: Licensed Clinical Social Worker

## 2022-05-20 DIAGNOSIS — F411 Generalized anxiety disorder: Secondary | ICD-10-CM

## 2022-05-20 DIAGNOSIS — F4310 Post-traumatic stress disorder, unspecified: Secondary | ICD-10-CM
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
## 2022-05-21 ENCOUNTER — Other Ambulatory Visit (HOSPITAL_COMMUNITY): Payer: Self-pay

## 2022-05-28 ENCOUNTER — Ambulatory Visit (HOSPITAL_COMMUNITY): Payer: Medicaid Other | Admitting: Psychiatry

## 2022-06-25 ENCOUNTER — Telehealth (HOSPITAL_COMMUNITY): Payer: Self-pay | Admitting: Licensed Clinical Social Worker
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
## 2022-07-20 NOTE — Psych (Signed)
Virtual Visit via Video Note  I connected with Anita [name] on 04/02/22 at  9:00 AM EDT by a video enabled telemedicine application and verified that I am speaking with the correct person using two identifiers.  Location: Patient: patient home Provider: clinical home office   I discussed the limitations of evaluation and management by telemedicine and the availability of in person appointments. The patient expressed understanding and agreed to proceed.  I discussed the assessment and treatment plan with the patient. The patient was provided an opportunity to ask questions and all were answered. The patient agreed with the plan and demonstrated an understanding of the instructions.   The patient was advised to call back or seek an in-person evaluation if the symptoms worsen or if the condition fails to improve as anticipated.  Pt was provided 200 minutes of non-face-to-face time during this encounter.   Lorin Glass, LCSW   [facility] PHP THERAPIST PROGRESS NOTE  Anita [name] [id number]  Session Time: 9:00 - 10:00  Participation Level: Active  Behavioral Response: CasualAlertDepressed  Type of Therapy: Group Therapy  Treatment Goals addressed: Coping  Progress Towards Goals: Initial  Interventions: CBT, DBT, Supportive, and Reframing  Summary: Clinician led check-in regarding current stressors and situation, and review of patient completed daily inventory. Clinician utilized active listening and empathetic response and validated patient emotions. Clinician facilitated processing group on pertinent issues.?    Summary: Anita [name] is a 21 y.o. female who presents with depression symptoms. Patient arrived within time allowed. Patient rates her mood at a 4 on a scale of 1-10 with 10 being best. Pt states she feels "not good." Pt reports yesterday was "not good." Pt reports interpersonal issues with ex-fiance and friends. Pt reports they are not consistently  supportive and cause "drama."  Pt states she was "manic" last night because she struggled to fall asleep and had a burst of energy to clean her room. Cln will observe for other symptoms. Patient able to process. Patient engaged in discussion.          Session Time: 10:00 am - 11:00 am   Participation Level: Active   Behavioral Response: CasualAlertDepressed   Type of Therapy: Group Therapy   Treatment Goals addressed: Coping   Progress Towards Goals: Progressing   Interventions: CBT, DBT, Solution Focused, Strength-based, Supportive, and Reframing   Therapist Response: Cln led discussion on personal standards and they way in which it impacts the way we view ourselves and our abilities. Group members discussed judgment, struggles, and barriers they experience in terms of personal standards. Cln brought in topics of balance, grace, and kindness. Cln proposed the "best friend test" as a way to calibrate whether we are viewing our situation with kindness or harshness.     Therapist Response: Pt engaged in discussion and reports willingness to utilize the best friend test.          Session Time: 11:00 -12:00   Participation Level: Active   Behavioral Response: CasualAlertDepressed   Type of Therapy: Group Therapy, Occupational Therapy   Treatment Goals addressed: Coping   Progress Towards Goals: Progressing   Interventions: Supportive, Education   Summary:  Occupational Therapy group led by cln E. Hollan.   Therapist Response: Pt participated       Session Time: 12:00 -1:00   Participation Level: Active   Behavioral Response: CasualAlertDepressed   Type of Therapy: Group therapy   Treatment Goals addressed: Coping   Progress Towards Goals: Progressing   Interventions:  CBT; Solution focused; Supportive; Reframing   Summary: 12:00 - 12:50: Cln introduced topic of CBT cognitive distortions. Cln discussed unhealthy thought patterns and how our thoughts shape our  reality and irrational thoughts can alter our perspective. Cln utilized handout "cognitive distortions" to discuss common examples of distorted thoughts and group members worked to identify examples in their own life.  12:50 -1:00 Clinician led check-out. Clinician assessed for immediate needs, medication compliance and efficacy, and safety concerns.   Therapist Response: 12:00 - 12:50: Pt left group at 12:18 stating her aunt was having a diabetic issue and needed to be taken to the hospital right then.        Suicidal/Homicidal: Nowithout intent/plan  Plan: Pt will continue in PHP while working to decrease depression symptoms, increase ADLs, and increase ability to manage symptoms in a healthy manner.   Collaboration of Care: Medication Management AEB T Lewis  Patient/Guardian was advised Release of Information must be obtained prior to any record release in order to collaborate their care with an outside provider. Patient/Guardian was advised if they have not already done so to contact the registration department to sign all necessary forms in order for [place] to release information regarding their care.   Consent: Patient/Guardian gives verbal consent for treatment and assignment of benefits for services provided during this visit. Patient/Guardian expressed understanding and agreed to proceed.   Diagnosis: Severe episode of recurrent major depressive disorder, without psychotic features (HCC) [F33.2]    1. Severe episode of recurrent major depressive disorder, without psychotic features (HCC)      [name], LCSW

## 2022-07-20 NOTE — Psych (Signed)
Virtual Visit via Video Note  I connected with Anita [name] on 03/28/22 at  9:00 AM EDT by a video enabled telemedicine application and verified that I am speaking with the correct person using two identifiers.  Location: Patient: patient home Provider: clinical home office   I discussed the limitations of evaluation and management by telemedicine and the availability of in person appointments. The patient expressed understanding and agreed to proceed.  I discussed the assessment and treatment plan with the patient. The patient was provided an opportunity to ask questions and all were answered. The patient agreed with the plan and demonstrated an understanding of the instructions.   The patient was advised to call back or seek an in-person evaluation if the symptoms worsen or if the condition fails to improve as anticipated.  Pt was provided 240 minutes of non-face-to-face time during this encounter.   [name], LCSW   [facility] PHP THERAPIST PROGRESS NOTE  Anita [name] [id number]  Session Time: 9:00 - 10:00  Participation Level: Active  Behavioral Response: CasualAlertDepressed  Type of Therapy: Group Therapy  Treatment Goals addressed: Coping  Progress Towards Goals: Initial  Interventions: CBT, DBT, Supportive, and Reframing  Summary: Clinician led check-in regarding current stressors and situation, and review of patient completed daily inventory. Clinician utilized active listening and empathetic response and validated patient emotions. Clinician facilitated processing group on pertinent issues.?    Summary: Anita [name] is a 21 y.o. female who presents with depression symptoms. Patient arrived within time allowed. Patient rates her mood at a 4 on a scale of 1-10 with 10 being best. Pt states she feels "tired." Pt reports she had a physical yesterday and otherwise did not leave the house. Pt states broken sleep and not feeling rested. Pt reports  increased appetite. Pt identifies self-harm thoughts yesterday and denies acting on them by utilizing [id number]. Pt struggles with consistency. Patient able to process. Patient engaged in discussion.          Session Time: 10:00 am - 11:00 am   Participation Level: Active   Behavioral Response: CasualAlertDepressed   Type of Therapy: Group Therapy   Treatment Goals addressed: Coping   Progress Towards Goals: Progressing   Interventions: CBT, DBT, Solution Focused, Strength-based, Supportive, and Reframing   Therapist Response: Cln led discussion on feelings and the role they play for our lives. Cln contextualized feelings as a warning system that brings attention to areas of our lives that need focus. Group members discussed how to pay attention to what the feeling is telling us versus reacting to unpleasant aspects of a feeling.    Therapist Response:  Pt engaged in discussion and reports understanding.            Session Time: 11:00 -12:00   Participation Level: Active   Behavioral Response: CasualAlertDepressed   Type of Therapy: Group Therapy, Occupational Therapy   Treatment Goals addressed: Coping   Progress Towards Goals: Progressing   Interventions: Supportive, Education   Summary:  Occupational Therapy group led by cln E. Hollan.   Therapist Response: Pt participated         Session Time: 12:00 -1:00   Participation Level: Active   Behavioral Response: CasualAlertDepressed   Type of Therapy: Group therapy   Treatment Goals addressed: Coping   Progress Towards Goals: Progressing   Interventions: CBT; Solution focused; Supportive; Reframing   Summary: 12:00 - 12:50: Cln introduced CBT and the way in which it can provide context for addressing stumbling  blocks. Group discussed "the problem is not the problem, the problem is how we're thinking about the problem" and tried to change perspective on current struggles.  12:50 -1:00 Clinician led check-out.  Clinician assessed for immediate needs, medication compliance and efficacy, and safety concerns.   Therapist Response: 12:00 - 12:50: Pt engaged in discussion and is able to attempt reframing using CBT.  12:50 - 1:00 pm: At check-out, patient reports no immediate concerns. Patient demonstrates progress as evidenced by participation in first group session. Patient denies SI/HI/self-harm thoughts at the end of group.      Suicidal/Homicidal: Nowithout intent/plan  Plan: Pt will continue in PHP while working to decrease depression symptoms, increase ADLs, and increase ability to manage symptoms in a healthy manner.   Collaboration of Care: Medication Management AEB T Lewis  Patient/Guardian was advised Release of Information must be obtained prior to any record release in order to collaborate their care with an outside provider. Patient/Guardian was advised if they have not already done so to contact the registration department to sign all necessary forms in order for [place] to release information regarding their care.   Consent: Patient/Guardian gives verbal consent for treatment and assignment of benefits for services provided during this visit. Patient/Guardian expressed understanding and agreed to proceed.   Diagnosis: Severe episode of recurrent major depressive disorder, without psychotic features ([place]) [F33.2]    1. Severe episode of recurrent major depressive disorder, without psychotic features ([place])      Lorin Glass, LCSW

## 2022-07-20 NOTE — Psych (Signed)
Virtual Visit via Video Note  I connected with Madie Reno Hanselman on 04/04/22 at  9:00 AM EDT by a video enabled telemedicine application and verified that I am speaking with the correct person using two identifiers.  Location: Patient: patient home Provider: clinical home office   I discussed the limitations of evaluation and management by telemedicine and the availability of in person appointments. The patient expressed understanding and agreed to proceed.  I discussed the assessment and treatment plan with the patient. The patient was provided an opportunity to ask questions and all were answered. The patient agreed with the plan and demonstrated an understanding of the instructions.   The patient was advised to call back or seek an in-person evaluation if the symptoms worsen or if the condition fails to improve as anticipated.  Pt was provided 240 minutes of non-face-to-face time during this encounter.   [name], LCSW   [facility] PHP THERAPIST PROGRESS NOTE  Linsi Humann [id number]  Session Time: 9:00 - 10:00  Participation Level: Active  Behavioral Response: CasualAlertDepressed  Type of Therapy: Group Therapy  Treatment Goals addressed: Coping  Progress Towards Goals: Progressing  Interventions: CBT, DBT, Supportive, and Reframing  Summary: Clinician led check-in regarding current stressors and situation, and review of patient completed daily inventory. Clinician utilized active listening and empathetic response and validated patient emotions. Clinician facilitated processing group on pertinent issues.?    Summary: Vivienne Sangiovanni is a 21 y.o. female who presents with depression symptoms. Patient arrived within time allowed. Patient rates her mood at a 5 on a scale of 1-10 with 10 being best. Pt states she feels "ehh." Pt reportshaving a good day yesterday until finding out her dad received her feelings letter. Pt reports struggling with a variety of  emotions. Pt identifies SH thoughts and states she reached out to a friend to manage urge. Pt struggles with emotional regulation. Patient able to process. Patient engaged in discussion.          Session Time: 10:00 am - 11:00 am   Participation Level: Active   Behavioral Response: CasualAlertDepressed   Type of Therapy: Group Therapy   Treatment Goals addressed: Coping   Progress Towards Goals: Progressing   Interventions: CBT, DBT, Solution Focused, Strength-based, Supportive, and Reframing   Therapist Response: Cln led discussion on safety and the ways in which we can seek and own it in ourselves. Group members shared how they view safety and situations which hinder safety. Cln encouraged pt's to think about emotional safety and ways to foster it.     Therapist Response: Pt engaged in discussion and reports gaining insight.          Session Time: 11:00 -12:00   Participation Level: Active   Behavioral Response: CasualAlertDepressed   Type of Therapy: Group Therapy, Occupational Therapy   Treatment Goals addressed: Coping   Progress Towards Goals: Progressing   Interventions: Supportive, Education   Summary:  Occupational Therapy group led by cln E. Hollan.   Therapist Response: Pt participated       Session Time: 12:00 -1:00   Participation Level: Active   Behavioral Response: CasualAlertDepressed   Type of Therapy: Group therapy   Treatment Goals addressed: Coping   Progress Towards Goals: Progressing   Interventions: CBT; Solution focused; Supportive; Reframing   Summary: 12:00 - 12:50: Cln continued topic of CBT cognitive distortions and introduced thought challenging as a way to  utilize the "challenge" C in C-C-C. Group utilized Administrator, Civil Service questions" as  a way to introduce challenges and reframe distorted thinking. Group members worked through pt examples to practice challenging distorted thinking.  12:50 -1:00 Clinician led check-out.  Clinician assessed for immediate needs, medication compliance and efficacy, and safety concerns.   Therapist Response: 12:00 - 12:50: Pt engaged in discussion and demonstrates understanding of challenging distorted thoughts through practice.  12:50 - 1:00 pm: At check-out, patient reports no immediate concerns. Patient demonstrates progress as evidenced by reaching out when upset. Patient denies SI/HI/self-harm thoughts at the end of group.    Suicidal/Homicidal: Nowithout intent/plan  Plan: Pt will continue in PHP while working to decrease depression symptoms, increase ADLs, and increase ability to manage symptoms in a healthy manner.   Collaboration of Care: Medication Management AEB T Lewis  Patient/Guardian was advised Release of Information must be obtained prior to any record release in order to collaborate their care with an outside provider. Patient/Guardian was advised if they have not already done so to contact the registration department to sign all necessary forms in order for [place] to release information regarding their care.   Consent: Patient/Guardian gives verbal consent for treatment and assignment of benefits for services provided during this visit. Patient/Guardian expressed understanding and agreed to proceed.   Diagnosis: Severe episode of recurrent major depressive disorder, without psychotic features ([place]) [F33.2]    1. Severe episode of recurrent major depressive disorder, without psychotic features ([place])      Lorin Glass, LCSW

## 2022-07-20 NOTE — Psych (Signed)
Virtual Visit via Video Note  I connected with Anita [name] on 04/05/22 at  9:00 AM EDT by a video enabled telemedicine application and verified that I am speaking with the correct person using two identifiers.  Location: Patient: patient home Provider: clinical home office   I discussed the limitations of evaluation and management by telemedicine and the availability of in person appointments. The patient expressed understanding and agreed to proceed.  I discussed the assessment and treatment plan with the patient. The patient was provided an opportunity to ask questions and all were answered. The patient agreed with the plan and demonstrated an understanding of the instructions.   The patient was advised to call back or seek an in-person evaluation if the symptoms worsen or if the condition fails to improve as anticipated.  Pt was provided 150 minutes of non-face-to-face time during this encounter.   Lorin Glass, LCSW   [facility] PHP THERAPIST PROGRESS NOTE  Anita [name] [id number]  Session Time: 9:00 - 10:00  Participation Level: Active  Behavioral Response: CasualAlertDepressed  Type of Therapy: Group Therapy  Treatment Goals addressed: Coping  Progress Towards Goals: Progressing  Interventions: CBT, DBT, Supportive, and Reframing  Summary: Clinician led check-in regarding current stressors and situation, and review of patient completed daily inventory. Clinician utilized active listening and empathetic response and validated patient emotions. Clinician facilitated processing group on pertinent issues.?    Summary: Anita [name] is a 21 y.o. female who presents with depression symptoms. Patient arrived within time allowed. Patient rates her mood at a 4 on a scale of 1-10 with 10 being best. Pt states she feels "okay." Pt reports she is sore from packing. Pt states she struggled with self harm when she found old tools and a friend disposed of them for  her. Pt reports sleeping well. Patient able to process. Patient engaged in discussion.          Session Time: 10:00 am - 11:00 am   Participation Level: Active   Behavioral Response: CasualAlertDepressed   Type of Therapy: Group Therapy   Treatment Goals addressed: Coping   Progress Towards Goals: Progressing   Interventions: CBT, DBT, Solution Focused, Strength-based, Supportive, and Reframing   Therapist Response: Cln continued topic of positive self-esteem. Group members shared struggles with appearance and how that impacts their self-esteem. Cln encouraged pt's to consider what their body does for them as a way to reframe physical self-esteem.     Therapist Response: Pt engaged in discussion and shares struggles. Pt is able to state something her body does for her.           Session Time: 11:00 -12:00   Participation Level: Active   Behavioral Response: CasualAlertDepressed   Type of Therapy: Group Therapy, Occupational Therapy   Treatment Goals addressed: Coping   Progress Towards Goals: Progressing   Interventions: Supportive, Education   Summary:  Occupational Therapy group led by cln E. Hollan.   Therapist Response: Pt participated ** Pt chose to leave group at 11:30 stating a housing issue. Pt denies SI/HI before leaving.    Suicidal/Homicidal: Nowithout intent/plan  Plan: Pt will continue in PHP while working to decrease depression symptoms, increase ADLs, and increase ability to manage symptoms in a healthy manner.   Collaboration of Care: Medication Management AEB T Lewis  Patient/Guardian was advised Release of Information must be obtained prior to any record release in order to collaborate their care with an outside provider. Patient/Guardian was advised if they have  not already done so to contact the registration department to sign all necessary forms in order for [place] to release information regarding their care.   Consent: Patient/Guardian gives  verbal consent for treatment and assignment of benefits for services provided during this visit. Patient/Guardian expressed understanding and agreed to proceed.   Diagnosis: Severe episode of recurrent major depressive disorder, without psychotic features ([facility]) [F33.2]    1. Severe episode of recurrent major depressive disorder, without psychotic features ([facility])      [name], LCSW

## 2022-07-20 NOTE — Psych (Signed)
Virtual Visit via Video Note  I connected with Anita [name] on 04/03/22 at  9:00 AM EDT by a video enabled telemedicine application and verified that I am speaking with the correct person using two identifiers.  Location: Patient: patient home Provider: clinical home office   I discussed the limitations of evaluation and management by telemedicine and the availability of in person appointments. The patient expressed understanding and agreed to proceed.  I discussed the assessment and treatment plan with the patient. The patient was provided an opportunity to ask questions and all were answered. The patient agreed with the plan and demonstrated an understanding of the instructions.   The patient was advised to call back or seek an in-person evaluation if the symptoms worsen or if the condition fails to improve as anticipated.  Pt was provided 240 minutes of non-face-to-face time during this encounter.   Lorin Glass, LCSW   [facility] PHP THERAPIST PROGRESS NOTE  Anita [name] [id number]  Session Time: 9:00 - 10:00  Participation Level: Active  Behavioral Response: CasualAlertDepressed  Type of Therapy: Group Therapy  Treatment Goals addressed: Coping  Progress Towards Goals: Progressing  Interventions: CBT, DBT, Supportive, and Reframing  Summary: Clinician led check-in regarding current stressors and situation, and review of patient completed daily inventory. Clinician utilized active listening and empathetic response and validated patient emotions. Clinician facilitated processing group on pertinent issues.?    Summary: Anita [name] is a 21 y.o. female who presents with depression symptoms. Patient arrived within time allowed. Patient rates her mood at a 8 on a scale of 1-10 with 10 being best. Pt states she feels "really good." Pt reports her aunt is okay after seeing a doctor. Pt reports being active yesterday and went to the grocery store, swimming, ran  errands, and had dinner out. Pt states she is physically and socially "exhausted" today "but it's a good exhausted." Pt struggles with mood dependent thinking. Patient able to process. Patient engaged in discussion.          Session Time: 10:00 am - 11:00 am   Participation Level: Active   Behavioral Response: CasualAlertDepressed   Type of Therapy: Group Therapy   Treatment Goals addressed: Coping   Progress Towards Goals: Progressing   Interventions: CBT, DBT, Solution Focused, Strength-based, Supportive, and Reframing   Therapist Response: Cln led processing group for pt's current struggles. Group members shared stressors and provided support and feedback. Cln brought in topics of boundaries, healthy relationships, and unhealthy thought processes to inform discussion.   Therapist Response: Pt able to process and provide support to group.            Session Time: 11:00 -12:00   Participation Level: Active   Behavioral Response: CasualAlertDepressed   Type of Therapy: Group Therapy, Spiritual Care   Treatment Goals addressed: Coping   Progress Towards Goals: Progressing   Interventions: Supportive, Education   Summary:  Anita [name], Chaplain, led group.   Therapist Response: Pt participated        Session Time: 12:00 -1:00   Participation Level: Active   Behavioral Response: CasualAlertDepressed   Type of Therapy: Group Therapy   Treatment Goals addressed: Coping   Progress Towards Goals: Progressing   Interventions: Supportive, Education   Summary:  Occupational Therapy group led by cln E. Hollan. 12:50 -1:00 Clinician led check-out. Clinician assessed for immediate needs, medication compliance and efficacy, and safety concerns   Therapist Response: 12:00 - 12:50: Pt participated 12:50 - 1:00 pm: At  check-out, patient reports no immediate concerns. Patient demonstrates progress as evidenced by increased activity. Patient denies SI/HI/self-harm  thoughts at the end of group.    Suicidal/Homicidal: Nowithout intent/plan  Plan: Pt will continue in PHP while working to decrease depression symptoms, increase ADLs, and increase ability to manage symptoms in a healthy manner.   Collaboration of Care: Medication Management AEB T Lewis  Patient/Guardian was advised Release of Information must be obtained prior to any record release in order to collaborate their care with an outside provider. Patient/Guardian was advised if they have not already done so to contact the registration department to sign all necessary forms in order for [place] to release information regarding their care.   Consent: Patient/Guardian gives verbal consent for treatment and assignment of benefits for services provided during this visit. Patient/Guardian expressed understanding and agreed to proceed.   Diagnosis: Severe episode of recurrent major depressive disorder, without psychotic features ([place]) [F33.2]    1. Severe episode of recurrent major depressive disorder, without psychotic features ([place])      Lorin Glass, LCSW

## 2022-07-20 NOTE — Psych (Signed)
Virtual Visit via Video Note  I connected with Anita [name] on 03/29/22 at  9:00 AM EDT by a video enabled telemedicine application and verified that I am speaking with the correct person using two identifiers.  Location: Patient: patient home Provider: clinical home office   I discussed the limitations of evaluation and management by telemedicine and the availability of in person appointments. The patient expressed understanding and agreed to proceed.  I discussed the assessment and treatment plan with the patient. The patient was provided an opportunity to ask questions and all were answered. The patient agreed with the plan and demonstrated an understanding of the instructions.   The patient was advised to call back or seek an in-person evaluation if the symptoms worsen or if the condition fails to improve as anticipated.  Pt was provided 240 minutes of non-face-to-face time during this encounter.   Lorin Glass, LCSW   [facility] PHP THERAPIST PROGRESS NOTE  Anita [name] [id number]  Session Time: 9:00 - 10:00  Participation Level: Active  Behavioral Response: CasualAlertDepressed  Type of Therapy: Group Therapy  Treatment Goals addressed: Coping  Progress Towards Goals: Initial  Interventions: CBT, DBT, Supportive, and Reframing  Summary: Clinician led check-in regarding current stressors and situation, and review of patient completed daily inventory. Clinician utilized active listening and empathetic response and validated patient emotions. Clinician facilitated processing group on pertinent issues.?    Summary: Anita [name] is a 21 y.o. female who presents with depression symptoms. Patient arrived within time allowed. Patient rates her mood at a 5 on a scale of 1-10 with 10 being best. Pt states she feels "okay." Pt reports she did not sleep well last night and is feeling tired. Pt reports she was able to eat. Pt identifies self harm thoughts and  reports she did not act due to wanting to honor the commitment to herself that she would not. Patient able to process. Patient engaged in discussion.          Session Time: 10:00 am - 11:00 am   Participation Level: Active   Behavioral Response: CasualAlertDepressed   Type of Therapy: Group Therapy   Treatment Goals addressed: Coping   Progress Towards Goals: Progressing   Interventions: CBT, DBT, Solution Focused, Strength-based, Supportive, and Reframing   Therapist Response: Cln led discussion on family dynamics and the way in which they impact [place]. Group members shared struggles with their families and the way the patterns of behaviors have negatively impacted them. Cln provided space to process and validated pt's experiences.     Therapist Response:  Pt engaged in discussion and is able to process.         Session Time: 11:00 -12:00   Participation Level: Active   Behavioral Response: CasualAlertDepressed   Type of Therapy: Group Therapy, Occupational Therapy   Treatment Goals addressed: Coping   Progress Towards Goals: Progressing   Interventions: Supportive, Education   Summary:  Occupational Therapy group led by cln E. Hollan.   Therapist Response: Pt participated         Session Time: 12:00 -1:00   Participation Level: Active   Behavioral Response: CasualAlertDepressed   Type of Therapy: Group therapy   Treatment Goals addressed: Coping   Progress Towards Goals: Progressing   Interventions: CBT; Solution focused; Supportive; Reframing   Summary: 12:00 - 12:50: Cln introduced DBT concept of radical acceptance. Cln discussed radical acceptance as a strategy to decrease distress and to manage situations outside of their control. Group  volunteered struggles they are experiencing with control and cln helped group apply radical acceptance.  12:50 -1:00 Clinician led check-out. Clinician assessed for immediate needs, medication compliance and efficacy,  and safety concerns.   Therapist Response: 12:00 - 12:50: Pt engaged in discussion and identified ways they can apply radical acceptance in their life.  12:50 - 1:00 pm: At check-out, patient reports no immediate concerns. Patient demonstrates progress as evidenced by managing self harm thoughts appropriately. Patient denies SI/HI/self-harm thoughts at the end of group.      Suicidal/Homicidal: Nowithout intent/plan  Plan: Pt will continue in PHP while working to decrease depression symptoms, increase ADLs, and increase ability to manage symptoms in a healthy manner.   Collaboration of Care: Medication Management AEB T Lewis  Patient/Guardian was advised Release of Information must be obtained prior to any record release in order to collaborate their care with an outside provider. Patient/Guardian was advised if they have not already done so to contact the registration department to sign all necessary forms in order for [place] to release information regarding their care.   Consent: Patient/Guardian gives verbal consent for treatment and assignment of benefits for services provided during this visit. Patient/Guardian expressed understanding and agreed to proceed.   Diagnosis: Severe episode of recurrent major depressive disorder, without psychotic features (HCC) [F33.2]    1. Severe episode of recurrent major depressive disorder, without psychotic features (HCC)      [name], LCSW

## 2022-07-20 NOTE — Psych (Signed)
Virtual Visit via Video Note  I connected with [name] on 04/01/22 at  9:00 AM EDT by a video enabled telemedicine application and verified that I am speaking with the correct person using two identifiers.  Location: Patient: patient home Provider: clinical home office   I discussed the limitations of evaluation and management by telemedicine and the availability of in person appointments. The patient expressed understanding and agreed to proceed.  I discussed the assessment and treatment plan with the patient. The patient was provided an opportunity to ask questions and all were answered. The patient agreed with the plan and demonstrated an understanding of the instructions.   The patient was advised to call back or seek an in-person evaluation if the symptoms worsen or if the condition fails to improve as anticipated.  Pt was provided 240 minutes of non-face-to-face time during this encounter.   Anita Glass, LCSW   [facility] PHP THERAPIST PROGRESS NOTE  Anita [name] [id number]  Session Time: 9:00 - 10:00  Participation Level: Active  Behavioral Response: CasualAlertDepressed  Type of Therapy: Group Therapy  Treatment Goals addressed: Coping  Progress Towards Goals: Initial  Interventions: CBT, DBT, Supportive, and Reframing  Summary: Clinician led check-in regarding current stressors and situation, and review of patient completed daily inventory. Clinician utilized active listening and empathetic response and validated patient emotions. Clinician facilitated processing group on pertinent issues.?    Summary: Anita [name] is a 21 y.o. female who presents with depression symptoms. Patient arrived within time allowed. Patient rates her mood at a 7 on a scale of 1-10 with 10 being best. Pt states she feels "on the jazz today." Pt reports she is in pain which is "normal" and has been up since 5am because of it. Pt reports talking to a new romantic  interest and feeling conflicted. Pt reports engaging in self harm over the weekend, cutting that did not require medical attention. Pt reports shame at self harm and is unable to say what she could have done differently. Patient able to process. Patient engaged in discussion.          Session Time: 10:00 am - 11:00 am   Participation Level: Active   Behavioral Response: CasualAlertDepressed   Type of Therapy: Group Therapy   Treatment Goals addressed: Coping   Progress Towards Goals: Progressing   Interventions: CBT, DBT, Solution Focused, Strength-based, Supportive, and Reframing   Therapist Response: Cln led discussion on personal standards and they way in which it impacts the way we view ourselves and our abilities. Group members discussed judgment, struggles, and barriers they experience in terms of personal standards. Cln brought in topics of balance, grace, and kindness. Cln proposed the "best friend test" as a way to calibrate whether we are viewing our situation with kindness or harshness.     Therapist Response: Pt engaged in discussion and reports willingness to utilize the best friend test.          Session Time: 11:00 -12:00   Participation Level: Active   Behavioral Response: CasualAlertDepressed   Type of Therapy: Group Therapy, Occupational Therapy   Treatment Goals addressed: Coping   Progress Towards Goals: Progressing   Interventions: Supportive, Education   Summary:  Occupational Therapy group led by cln E. Hollan.   Therapist Response: Pt participated       Session Time: 12:00 -1:00   Participation Level: Active   Behavioral Response: CasualAlertDepressed   Type of Therapy: Group therapy   Treatment Goals addressed:  Coping   Progress Towards Goals: Progressing   Interventions: CBT; Solution focused; Supportive; Reframing   Summary: 12:00 - 12:50: Cln introduced topic of CBT cognitive distortions. Cln discussed unhealthy thought patterns  and how our thoughts shape our reality and irrational thoughts can alter our perspective. Cln utilized handout "cognitive distortions" to discuss common examples of distorted thoughts and group members worked to identify examples in their own life.  12:50 -1:00 Clinician led check-out. Clinician assessed for immediate needs, medication compliance and efficacy, and safety concerns.   Therapist Response: 12:00 - 12:50: Pt engaged in discussion and is able to determine examples of distorted thinking in their own life.  12:50 - 1:00 pm: At check-out, patient reports no immediate concerns. Patient demonstrates progress as evidenced by handling practical matters. Patient denies SI/HI/self-harm thoughts at the end of group.      Suicidal/Homicidal: Nowithout intent/plan  Plan: Pt will continue in PHP while working to decrease depression symptoms, increase ADLs, and increase ability to manage symptoms in a healthy manner.   Collaboration of Care: Medication Management AEB T Lewis  Patient/Guardian was advised Release of Information must be obtained prior to any record release in order to collaborate their care with an outside provider. Patient/Guardian was advised if they have not already done so to contact the registration department to sign all necessary forms in order for [place] to release information regarding their care.   Consent: Patient/Guardian gives verbal consent for treatment and assignment of benefits for services provided during this visit. Patient/Guardian expressed understanding and agreed to proceed.   Diagnosis: Severe episode of recurrent major depressive disorder, without psychotic features (HCC) [F33.2]    1. Severe episode of recurrent major depressive disorder, without psychotic features (HCC)      Anita Guiles, LCSW

## 2022-07-22 ENCOUNTER — Ambulatory Visit (HOSPITAL_COMMUNITY): Payer: Medicaid Other | Admitting: Licensed Clinical Social Worker

## 2022-07-22 ENCOUNTER — Encounter (HOSPITAL_COMMUNITY): Payer: Self-pay

## 2022-07-22 NOTE — Progress Notes (Signed)
Cln signed on at 3:00 pm, sent text link for video session per schedule, and remained online for session until 3:15 pm. Pt failed to sign on for session.    

## 2022-09-08 IMAGING — CR DG ANKLE COMPLETE 3+V*R*
3 series · 3 of 3 positions shown · non-contrast
Comparison: None.

CLINICAL DATA: Right ankle pain.  Recent fall.  Initial encounter.

EXAM:
RIGHT ANKLE - COMPLETE 3+ VIEW

[t ankle joint ap right]
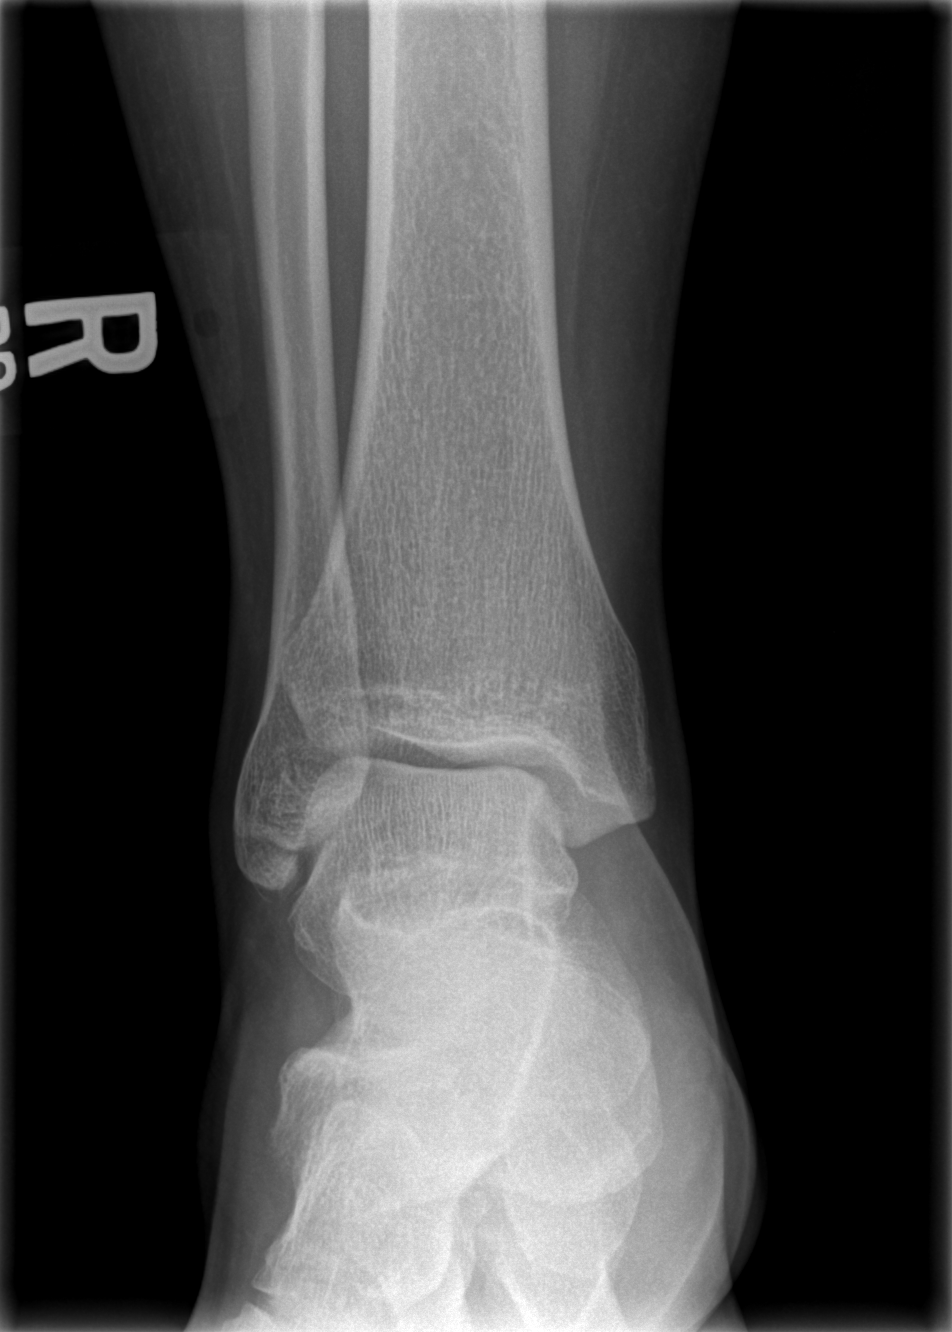

[t ankle joint oblique right]
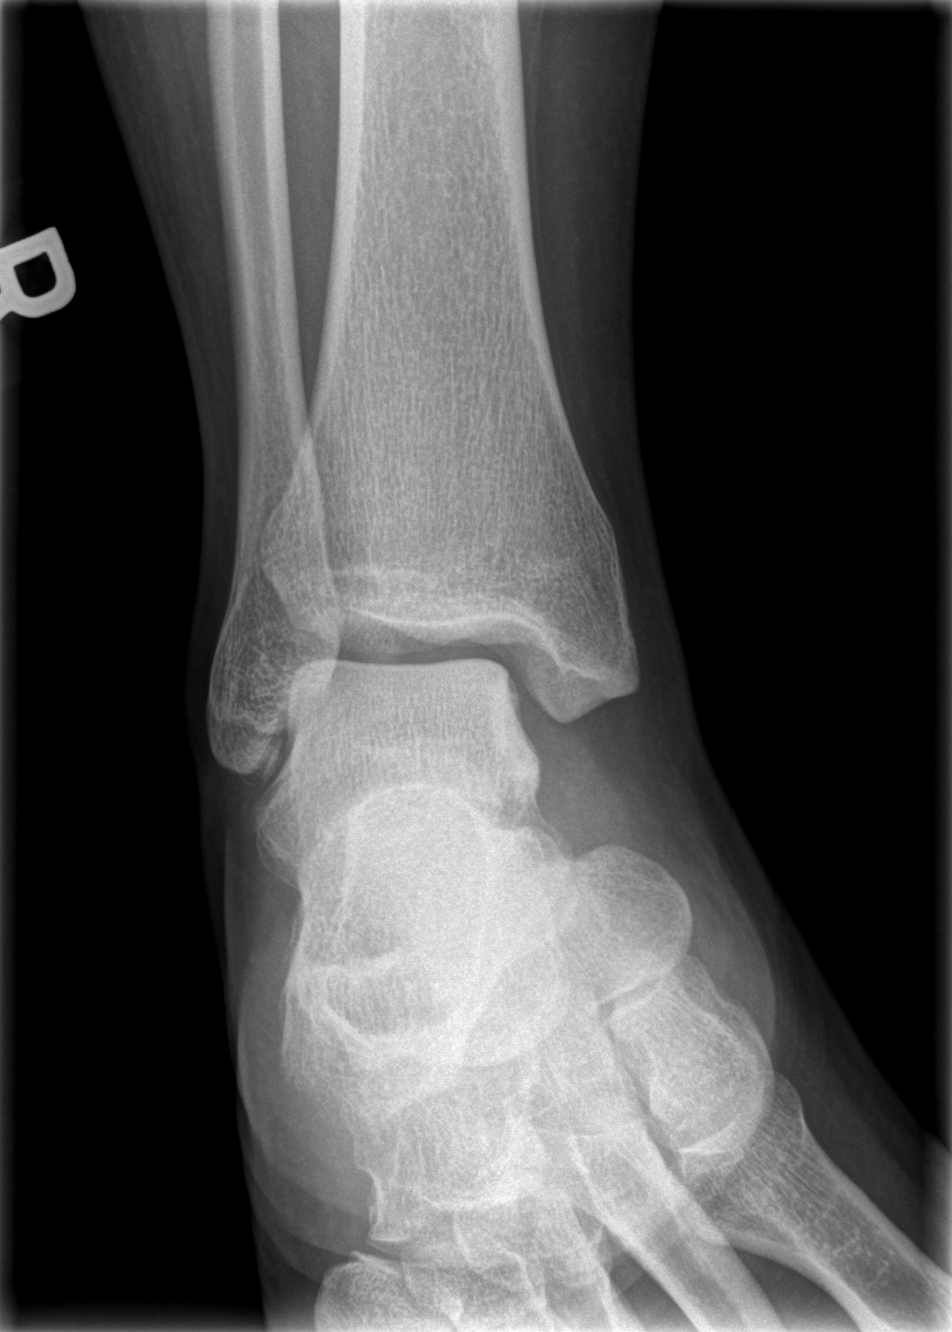

[t ankle joint lat right]
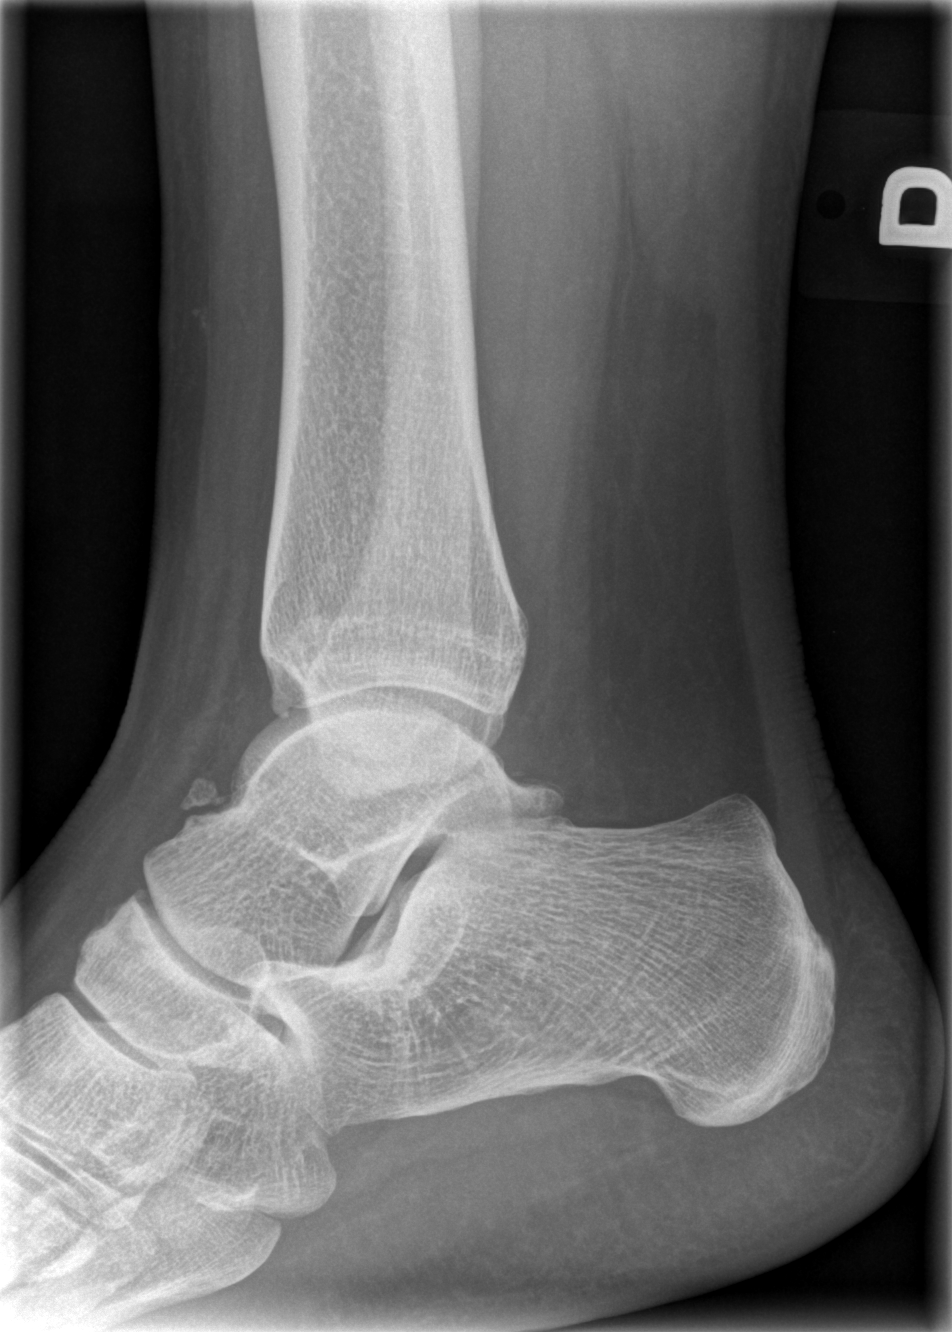

[3 of 3 positions shown; findings below may reference images not displayed]

FINDINGS: Well corticated bone fragments are seen dorsal to the neck of the
talus and off the lateral malleolus most consistent with chronic
change. Imaged bones otherwise appear normal. Soft tissues are
unremarkable.
IMPRESSION: No acute abnormality.

Well corticated bone fragment off the tip of the lateral malleolus
is consistent with remote injury. Small well corticated fragment
dorsal to the neck of the talus may be a loose body and is of highly
doubtful clinical significance.

## 2022-10-06 IMAGING — MR MR ANKLE*R* W/O CM
5 series · 37 of 40 positions shown · non-contrast
Comparison: Radiographs 08/16/2020.

CLINICAL DATA: Dorsal ankle pain for 3-4 months following injury.
No previous relevant surgery. Osteochondral lesions suspected.

EXAM:
MRI OF THE RIGHT ANKLE WITHOUT CONTRAST
TECHNIQUE: Multiplanar, multisequence MR imaging of the ankle was performed. No
intravenous contrast was administered.

[Series 4: T2 fat-sat · axial · 3.0mm · 0.50mm/px · z∈[-147,-26]mm · 8 of 32 slices shown (1 of 2)]
[im 1/32]
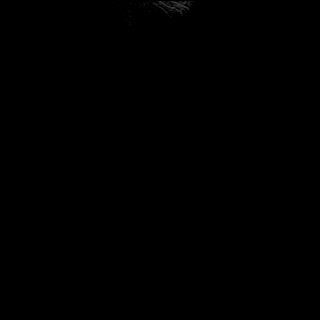
[im 4/32]
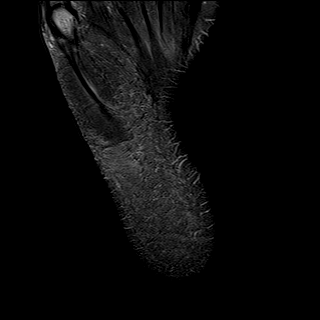
[im 11/32]
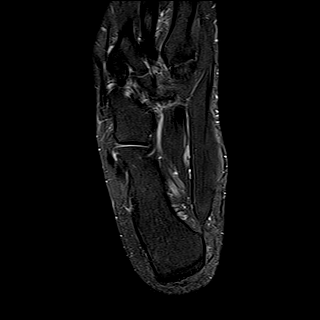
[im 14/32]
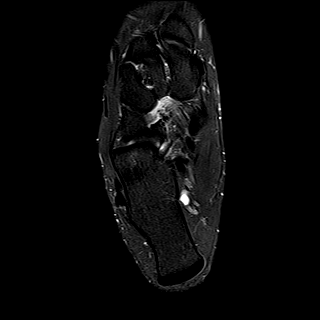
[im 18/32]
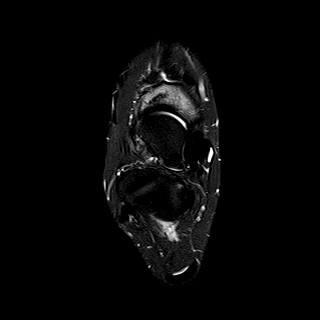
[im 21/32]
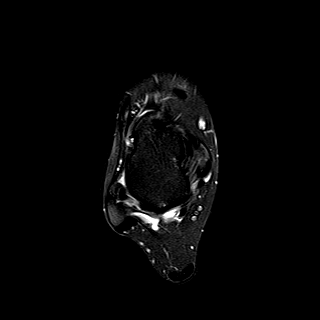
[im 28/32]
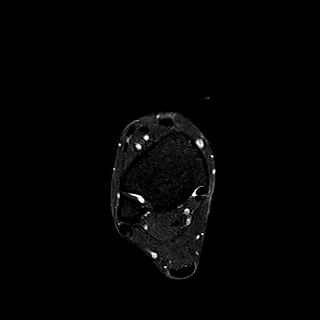
[im 32/32]
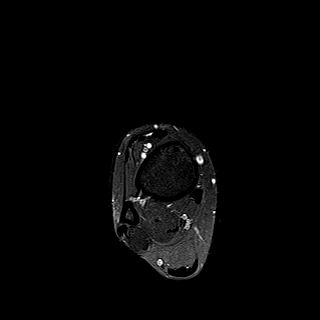

[Series 5: PD fat-sat · axial · 3.0mm · 0.42mm/px · z∈[-147,-26]mm · 9 of 32 slices shown]
[im 1/32]
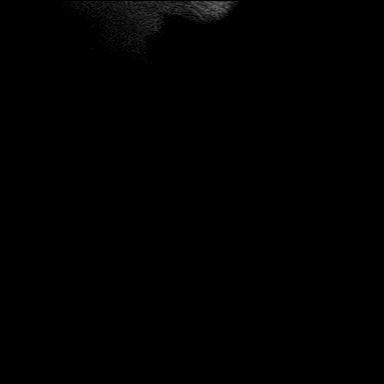
[im 4/32]
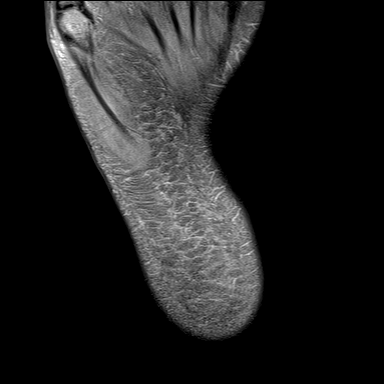
[im 8/32]
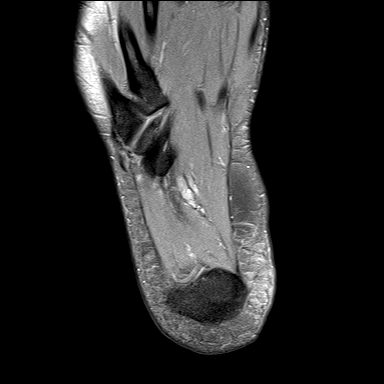
[im 12/32]
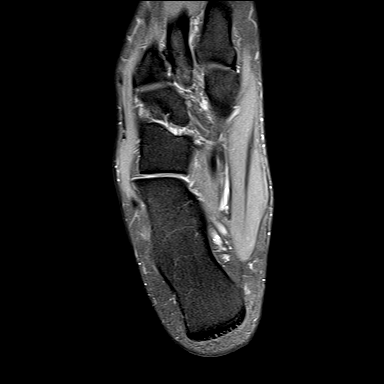
[im 16/32]
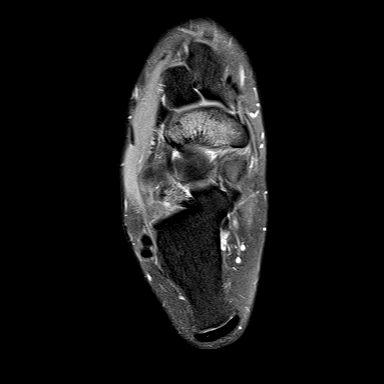
[im 20/32]
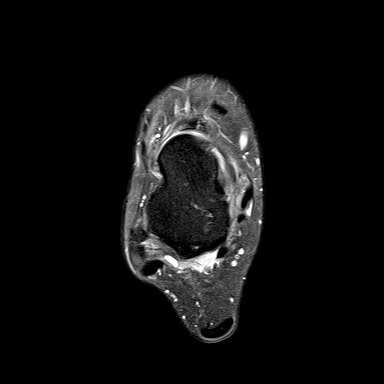
[im 24/32]
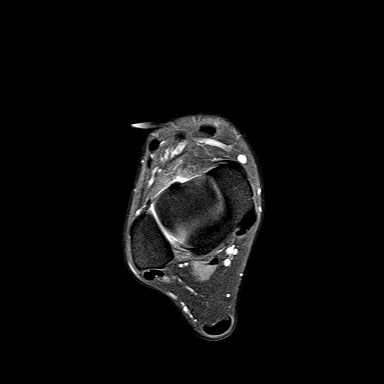
[im 28/32]
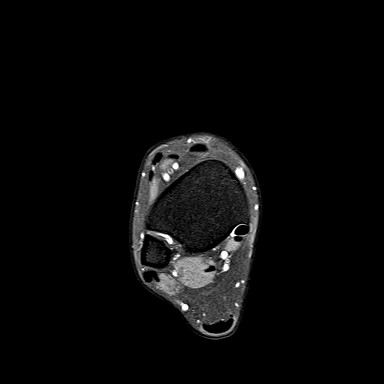
[im 32/32]
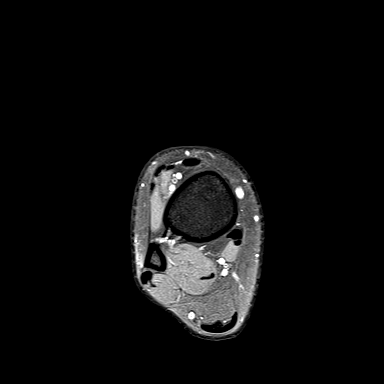

[Series 6: T1 · sagittal · 4.0mm · 0.56mm/px · 5 of 18 slices shown]
[im 1/18]
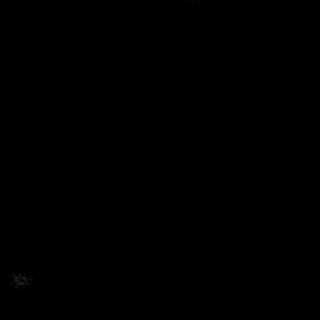
[im 5/18]
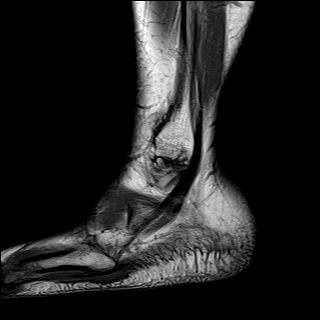
[im 9/18]
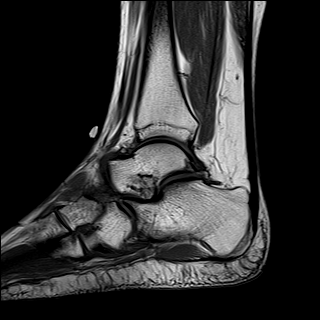
[im 13/18]
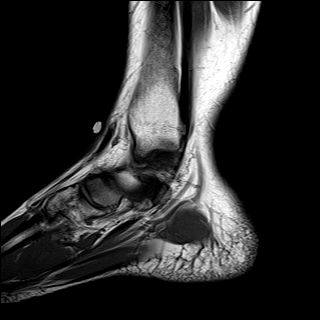
[im 18/18]
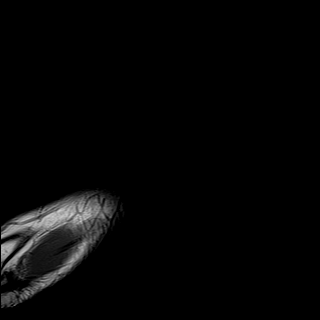

[Series 7: STIR · sagittal · 4.0mm · 0.35mm/px · 4 of 18 slices shown]
[im 1/18]
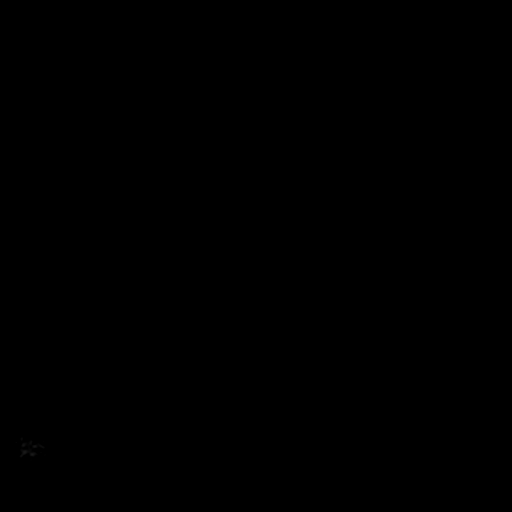
[im 5/18]
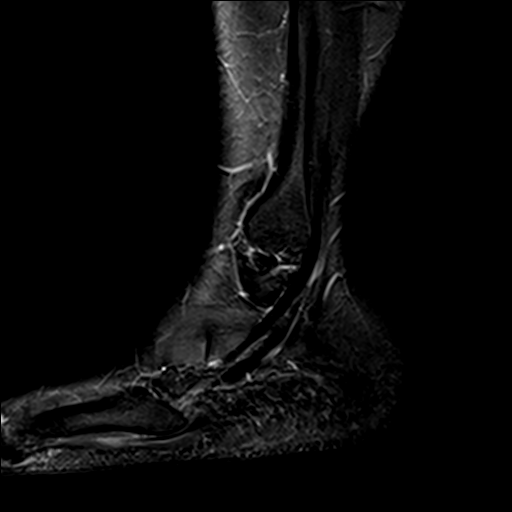
[im 9/18]
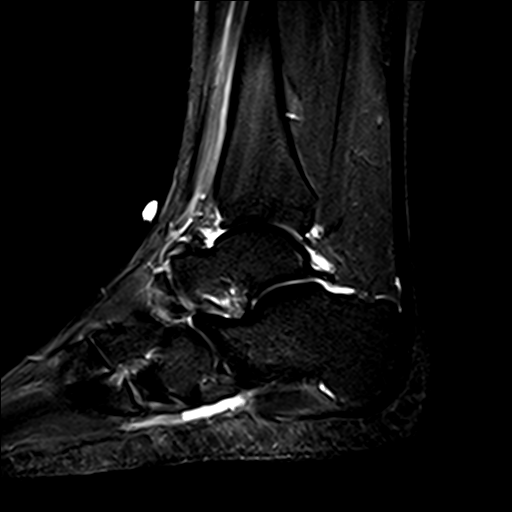
[im 13/18]
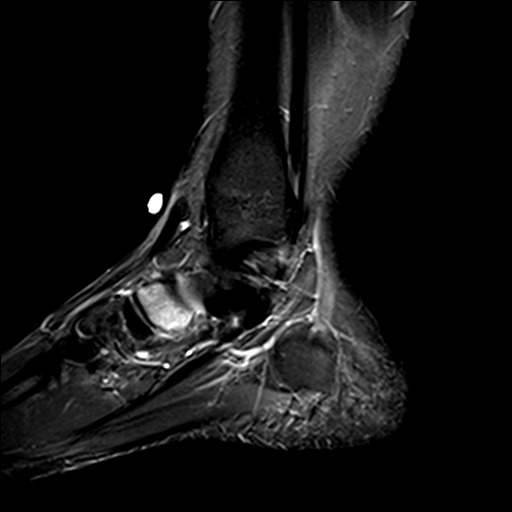

[Series 8: T2 fat-sat · coronal · 3.0mm · 0.50mm/px · 11 of 38 slices shown (2 of 2)]
[im 1/38]
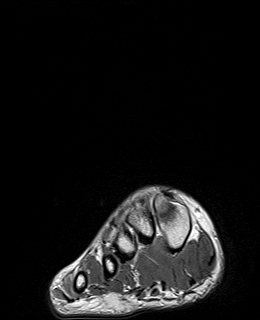
[im 4/38]
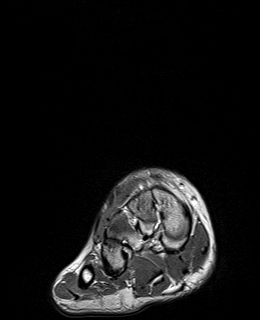
[im 8/38]
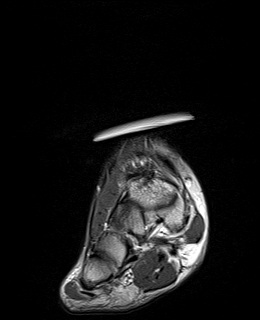
[im 12/38]
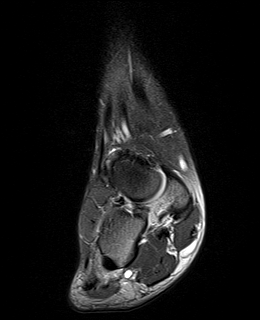
[im 15/38]
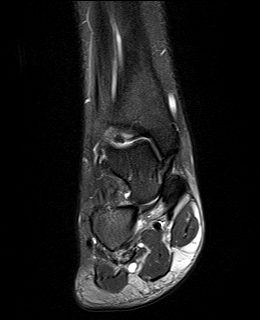
[im 19/38]
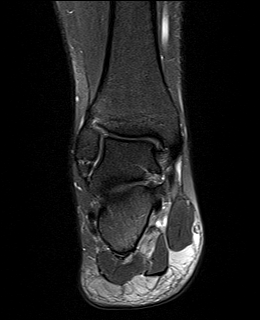
[im 23/38]
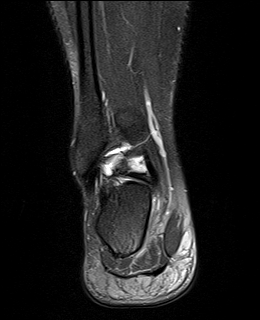
[im 26/38]
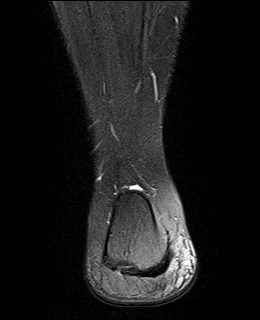
[im 30/38]
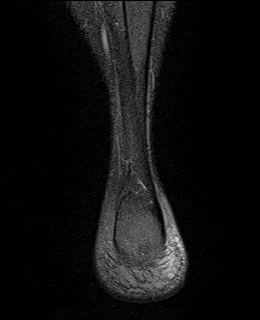
[im 34/38]
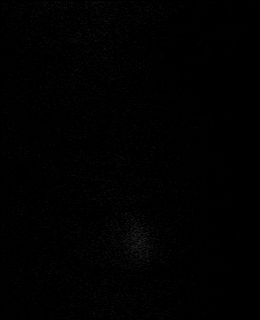
[im 38/38]
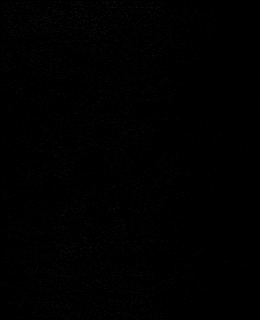

[37 of 40 positions shown; findings below may reference images not displayed]

FINDINGS: TENDONS

Peroneal: Intact and normally positioned.

Posteromedial: Intact and normally positioned.

Anterior: Intact and normally positioned.

Achilles: Intact.

Plantar Fascia: Intact.

LIGAMENTS

Lateral: The anterior and posterior talofibular and calcaneofibular
ligaments are intact.

Medial: The deltoid and visualized portions of the spring ligament
appear intact.

CARTILAGE AND BONES

Ankle Joint: No significant ankle joint effusion. The talar dome and
tibial plafond are intact.

Subtalar Joints/Sinus Tarsi: Unremarkable.

Bones: There is prominent bone marrow edema throughout the
navicular. Best seen on the axial and sagittal T2 weighted images is
linear low signal dorsally in the navicular suspicious for an
underlying stress fracture. There is no osseous fragmentation or
subchondral collapse to suggest osteonecrosis. No other acute
osseous findings are seen. Well corticated osseous fragments are
seen adjacent to the lateral malleolus and dorsal neck of the talus,
without bone marrow edema, as seen on prior radiographs.

Other: No significant soft tissue findings.
IMPRESSION: 1. Prominent bone marrow edema throughout the navicular suspicious
for an underlying stress fracture. No osseous fragmentation or
subchondral collapse to suggest osteonecrosis.
2. No evidence of osteochondral lesion.
3. The ankle tendons and ligaments appear intact.
